# Patient Record
Sex: Male | Born: 1985 | Race: White | Hispanic: No | Marital: Single | Smoking: Never smoker
Health system: Southern US, Community
[De-identification: ages and names within clinical notes are randomized; demographics above are authoritative.]

## PROBLEM LIST (undated history)

## (undated) DIAGNOSIS — I10 Essential (primary) hypertension: Secondary | ICD-10-CM

## (undated) DIAGNOSIS — F32A Depression, unspecified: Secondary | ICD-10-CM

## (undated) DIAGNOSIS — F419 Anxiety disorder, unspecified: Secondary | ICD-10-CM

## (undated) DIAGNOSIS — F4310 Post-traumatic stress disorder, unspecified: Secondary | ICD-10-CM

## (undated) DIAGNOSIS — K219 Gastro-esophageal reflux disease without esophagitis: Secondary | ICD-10-CM

## (undated) HISTORY — DX: Gastro-esophageal reflux disease without esophagitis: K21.9

## (undated) HISTORY — PX: HERNIA REPAIR: SHX51

---

## 2003-04-21 ENCOUNTER — Encounter: Payer: Self-pay | Admitting: Emergency Medicine

## 2003-04-21 ENCOUNTER — Emergency Department (HOSPITAL_COMMUNITY): Admission: EM | Admit: 2003-04-21 | Discharge: 2003-04-21 | Payer: Self-pay | Admitting: Emergency Medicine

## 2003-05-01 ENCOUNTER — Emergency Department (HOSPITAL_COMMUNITY): Admission: EM | Admit: 2003-05-01 | Discharge: 2003-05-01 | Payer: Self-pay | Admitting: Emergency Medicine

## 2019-02-12 ENCOUNTER — Emergency Department: Payer: Self-pay

## 2019-02-12 ENCOUNTER — Other Ambulatory Visit: Payer: Self-pay

## 2019-02-12 ENCOUNTER — Emergency Department
Admission: EM | Admit: 2019-02-12 | Discharge: 2019-02-12 | Disposition: A | Discharge status: Home / Self Care | Payer: Self-pay | Attending: Emergency Medicine | Admitting: Emergency Medicine

## 2019-02-12 ENCOUNTER — Encounter: Payer: Self-pay | Admitting: Emergency Medicine

## 2019-02-12 DIAGNOSIS — R1011 Right upper quadrant pain: Secondary | ICD-10-CM

## 2019-02-12 DIAGNOSIS — K8050 Calculus of bile duct without cholangitis or cholecystitis without obstruction: Secondary | ICD-10-CM | POA: Insufficient documentation

## 2019-02-12 LAB — CBC WITH DIFFERENTIAL/PLATELET
Abs Immature Granulocytes: 0.04 10*3/uL (ref 0.00–0.07)
Basophils Absolute: 0.1 10*3/uL (ref 0.0–0.1)
Basophils Relative: 1 %
Eosinophils Absolute: 0.9 10*3/uL — ABNORMAL HIGH (ref 0.0–0.5)
Eosinophils Relative: 8 %
HCT: 46.8 % (ref 39.0–52.0)
Hemoglobin: 15.6 g/dL (ref 13.0–17.0)
Immature Granulocytes: 0 %
Lymphocytes Relative: 31 %
Lymphs Abs: 3.3 10*3/uL (ref 0.7–4.0)
MCH: 29.2 pg (ref 26.0–34.0)
MCHC: 33.3 g/dL (ref 30.0–36.0)
MCV: 87.5 fL (ref 80.0–100.0)
Monocytes Absolute: 1.1 10*3/uL — ABNORMAL HIGH (ref 0.1–1.0)
Monocytes Relative: 10 %
Neutro Abs: 5.4 10*3/uL (ref 1.7–7.7)
Neutrophils Relative %: 50 %
Platelets: 329 10*3/uL (ref 150–400)
RBC: 5.35 MIL/uL (ref 4.22–5.81)
RDW: 12.7 % (ref 11.5–15.5)
WBC: 10.9 10*3/uL — ABNORMAL HIGH (ref 4.0–10.5)
nRBC: 0 % (ref 0.0–0.2)

## 2019-02-12 LAB — BASIC METABOLIC PANEL
Anion gap: 10 (ref 5–15)
BUN: 12 mg/dL (ref 6–20)
CO2: 25 mmol/L (ref 22–32)
Calcium: 8.9 mg/dL (ref 8.9–10.3)
Chloride: 103 mmol/L (ref 98–111)
Creatinine, Ser: 0.96 mg/dL (ref 0.61–1.24)
GFR calc Af Amer: >60 mL/min (ref >60)
GFR calc non Af Amer: >60 mL/min (ref >60)
Glucose, Bld: 136 mg/dL — ABNORMAL HIGH (ref 70–99)
Potassium: 3.3 mmol/L — ABNORMAL LOW (ref 3.5–5.1)
Sodium: 138 mmol/L (ref 135–145)

## 2019-02-12 LAB — URINALYSIS, COMPLETE (UACMP) WITH MICROSCOPIC
Bacteria, UA: NONE SEEN
Bilirubin Urine: NEGATIVE
Glucose, UA: NEGATIVE mg/dL
Hgb urine dipstick: NEGATIVE
Ketones, ur: NEGATIVE mg/dL
Leukocytes,Ua: NEGATIVE
Nitrite: NEGATIVE
Protein, ur: NEGATIVE mg/dL
Specific Gravity, Urine: 1.026 (ref 1.005–1.030)
pH: 6 (ref 5.0–8.0)

## 2019-02-12 LAB — HEPATIC FUNCTION PANEL
ALT: 24 U/L (ref 0–44)
AST: 26 U/L (ref 15–41)
Albumin: 4.6 g/dL (ref 3.5–5.0)
Alkaline Phosphatase: 60 U/L (ref 38–126)
Bilirubin, Direct: <0.1 mg/dL (ref 0.0–0.2)
Total Bilirubin: 0.4 mg/dL (ref 0.3–1.2)
Total Protein: 7.4 g/dL (ref 6.5–8.1)

## 2019-02-12 LAB — LIPASE, BLOOD: Lipase: 36 U/L (ref 11–51)

## 2019-02-12 MED ORDER — IOHEXOL 300 MG/ML  SOLN
100.0000 mL | Freq: Once | INTRAMUSCULAR | Status: AC | PRN
  Administered 2019-02-12: 100 mL via INTRAVENOUS

## 2019-02-12 MED ORDER — ONDANSETRON HCL 4 MG/2ML IJ SOLN
4.0000 mg | Freq: Once | INTRAMUSCULAR | Status: AC
  Administered 2019-02-12: 20:00:00 4 mg via INTRAVENOUS
  Filled 2019-02-12: qty 2

## 2019-02-12 MED ORDER — SODIUM CHLORIDE 0.9 % IV BOLUS
1000.0000 mL | Freq: Once | INTRAVENOUS | Status: AC
  Administered 2019-02-12: 21:00:00 1000 mL via INTRAVENOUS

## 2019-02-12 MED ORDER — HYDROMORPHONE HCL 1 MG/ML IJ SOLN
1.0000 mg | Freq: Once | INTRAMUSCULAR | Status: AC
  Administered 2019-02-12: 22:00:00 1 mg via INTRAVENOUS
  Filled 2019-02-12: qty 1

## 2019-02-12 MED ORDER — OXYCODONE-ACETAMINOPHEN 5-325 MG PO TABS: 1.0000 | ORAL_TABLET | Freq: Four times a day (QID) | ORAL | 0 refills | Status: AC | PRN

## 2019-02-12 MED ORDER — HYDROMORPHONE HCL 1 MG/ML IJ SOLN
1.0000 mg | Freq: Once | INTRAMUSCULAR | Status: AC
  Administered 2019-02-12: 1 mg via INTRAVENOUS
  Filled 2019-02-12: qty 1

## 2019-02-12 NOTE — ED Provider Notes (Signed)
[facility] Emergency Department Provider Note ____________________________________________   First MD Initiated Contact with Patient 02/12/19 1955     (approximate)  I have reviewed the triage vital signs and the nursing notes.   HISTORY  Chief Complaint Abdominal Pain    HPI Brett Murray is a 33 y.o. male with no significant PMH who presents with right upper quadrant abdominal pain, acute onset around 2 hours ago, associated with nausea but no vomiting.  He states he had an episode of pain like this a few months ago which lasted for a few hours and then resolved on its own, but this is more severe.  He denies any change in his bowel movements.  He has no urinary symptoms.  He states he had an ultrasound in the past that showed some gallstones.  History reviewed. No pertinent past medical history.  There are no active problems to display for this patient.   History reviewed. No pertinent surgical history.  Prior to Admission medications   Medication Sig Start Date End Date Taking? Authorizing Provider  oxyCODONE-acetaminophen (PERCOCET) 5-325 MG tablet Take 1-2 tablets by mouth every 6 (six) hours as needed for up to 5 days for severe pain. 02/12/19 02/17/19  Dionne Bucy, MD    Allergies Patient has no known allergies.  No family history on file.  Social History Social History   Tobacco Use   Smoking status: Never Smoker   Smokeless tobacco: Never Used  Substance Use Topics   Alcohol use: Yes    Comment: "rarely"   Drug use: Not on file    Review of Systems  Constitutional: No fever. Eyes: No visual changes. ENT: No sore throat. Cardiovascular: Denies chest pain. Respiratory: Denies shortness of breath or cough. Gastrointestinal: Positive for nausea. Genitourinary: Negative for flank pain.  Musculoskeletal: Negative for back pain. Skin: Negative for rash. Neurological: Negative for headaches, focal weakness or  numbness.   ____________________________________________   PHYSICAL EXAM:  VITAL SIGNS: ED Triage Vitals  Enc Vitals Group     BP 02/12/19 1944 (!) 152/88     Pulse Rate 02/12/19 1944 80     Resp 02/12/19 1944 12     Temp 02/12/19 1944 98.8 F (37.1 C)     Temp Source 02/12/19 1944 Oral     SpO2 02/12/19 1944 100 %     Weight 02/12/19 1936 200 lb (90.7 kg)     Height 02/12/19 1936  (1.702 m)     Head Circumference --      Peak Flow --      Pain Score 02/12/19 1936 9     Pain Loc --      Pain Edu? --      Excl. in GC? --     Constitutional: Alert and oriented.  Uncomfortable appearing but in no acute distress. Eyes: Conjunctivae are normal.  No scleral icterus. Head: Atraumatic. Nose: No congestion/rhinnorhea. Mouth/Throat: Mucous membranes are moist.   Neck: Normal range of motion.  Cardiovascular: Good peripheral circulation. Respiratory: Normal respiratory effort.  No retractions. Gastrointestinal: Soft with moderate right upper quadrant tenderness.  No distention.  Genitourinary: No flank tenderness. Musculoskeletal: Extremities warm and well perfused.  Neurologic:  Normal speech and language. No gross focal neurologic deficits are appreciated.  Skin:  Skin is warm and dry. No rash noted. Psychiatric: Mood and affect are normal. Speech and behavior are normal.  ____________________________________________   LABS (all labs ordered are listed, but only abnormal results are displayed)  Labs Reviewed  BASIC METABOLIC PANEL - Abnormal; Notable for the following components:      Result Value   Potassium 3.3 (*)    Glucose, Bld 136 (*)    All other components within normal limits  CBC WITH DIFFERENTIAL/PLATELET - Abnormal; Notable for the following components:   WBC 10.9 (*)    Monocytes Absolute 1.1 (*)    Eosinophils Absolute 0.9 (*)    All other components within normal limits  URINALYSIS, COMPLETE (UACMP) WITH MICROSCOPIC - Abnormal; Notable for the  following components:   Color, Urine STRAW (*)    APPearance CLEAR (*)    All other components within normal limits  HEPATIC FUNCTION PANEL  LIPASE, BLOOD   ____________________________________________  EKG   ____________________________________________  RADIOLOGY  [place] RUQ: No acute abnormality CT abdomen: Gallstone with no other acute abnormality  ____________________________________________   PROCEDURES  Procedure(s) performed: No  Procedures  Critical Care performed: No ____________________________________________   INITIAL IMPRESSION / ASSESSMENT AND PLAN / ED COURSE  Pertinent labs & imaging results that were available during my care of the patient were reviewed by me and considered in my medical decision making (see chart for details).  33 year old male with no significant PMH (although the patient does report he had an ultrasound which showed gallstones in the past) presents with acute onset of right upper quadrant pain over the last few hours associated with nausea.  He states he had a similar episode a few months ago that resolved on its own, and he never sought care at that time.  On exam he is uncomfortable and in acute pain.  He is hypertensive but his other vital signs are normal.  He is tender in the right upper quadrant.  Overall presentation is most consistent with biliary colic versus acute cholecystitis.  Differential also includes pancreatitis, gastritis, or less likely colitis.  He has no tenderness in the right lower quadrant.  We will obtain lab work-up, right upper quadrant ultrasound, and give analgesia and fluids.  ----------------------------------------- 11:13 PM on 02/12/2019 -----------------------------------------  Ultrasound showed no acute abnormalities, however given the patient's severe pain I was still concerned for possible hepatobiliary etiology.  CT was obtained which showed a gallstone.  Neither imaging test demonstrates wall  thickening or pericholecystic fluid.  The lab work-up is unremarkable.  The patient's pain is now completely resolved.  Overall presentation is consistent with biliary colic.  At this time, the patient is stable for discharge home.  I counseled him on the results of the work-up.  He will need to follow-up with a general surgeon although he likely will not be able to have an elective cholecystectomy anytime soon given the current COVID-19 pandemic.  I gave him thorough return precautions and a referral to general surgery.  He expressed understanding, and agreed with the plan.  ______________________  Junious Silk was evaluated in Emergency Department on 02/12/2019 for the symptoms described in the history of present illness. He was evaluated in the context of the global COVID-19 pandemic, which necessitated consideration that the patient might be at risk for infection with the SARS-CoV-2 virus that causes COVID-19. Institutional protocols and algorithms that pertain to the evaluation of patients at risk for COVID-19 are in a state of rapid change based on information released by regulatory bodies including the CDC and federal and state organizations. These policies and algorithms were followed during the patient's care in the ED. ____________________________________________   FINAL CLINICAL IMPRESSION(S) / ED DIAGNOSES  Final diagnoses:  Right upper quadrant abdominal pain  Biliary colic      NEW MEDICATIONS STARTED DURING THIS VISIT:  New Prescriptions   OXYCODONE-ACETAMINOPHEN (PERCOCET) 5-325 MG TABLET    Take 1-2 tablets by mouth every 6 (six) hours as needed for up to 5 days for severe pain.     Note:  This document was prepared using Dragon voice recognition software and may include unintentional dictation errors.    Dionne BucySiadecki, Daemian Gahm, MD 02/12/19 2315

## 2019-02-12 NOTE — ED Triage Notes (Signed)
Patient ambulatory to triage with steady gait, without difficulty, appears uncomfortable, grimacing; c/o sudden onset right upper abd pain radiating into back upon awakening at 630pm (works 3rd shift); denies any accomp symptoms, denies hx of same

## 2019-02-12 NOTE — Discharge Instructions (Addendum)
Make an appointment to follow-up with a general surgeon within the next few weeks.  Return to the ER for new, worsening, or persistent severe abdominal pain, vomiting, fever, weakness, or any other new or worsening symptoms that concern you.

## 2019-06-10 ENCOUNTER — Other Ambulatory Visit: Payer: Self-pay

## 2019-06-10 DIAGNOSIS — Z20822 Contact with and (suspected) exposure to covid-19: Secondary | ICD-10-CM

## 2019-06-11 LAB — NOVEL CORONAVIRUS, NAA: SARS-CoV-2, NAA: NOT DETECTED

## 2019-08-08 ENCOUNTER — Other Ambulatory Visit: Payer: Self-pay

## 2019-08-08 DIAGNOSIS — Z20822 Contact with and (suspected) exposure to covid-19: Secondary | ICD-10-CM

## 2019-08-10 LAB — NOVEL CORONAVIRUS, NAA: SARS-CoV-2, NAA: NOT DETECTED

## 2020-05-17 ENCOUNTER — Other Ambulatory Visit: Payer: Self-pay

## 2020-05-17 ENCOUNTER — Emergency Department (HOSPITAL_COMMUNITY)
Admission: EM | Admit: 2020-05-17 | Discharge: 2020-05-18 | Disposition: A | Discharge status: Home / Self Care | Payer: No Typology Code available for payment source | Attending: Emergency Medicine | Admitting: Emergency Medicine

## 2020-05-17 ENCOUNTER — Encounter (HOSPITAL_COMMUNITY): Payer: Self-pay | Admitting: Emergency Medicine

## 2020-05-17 DIAGNOSIS — F10929 Alcohol use, unspecified with intoxication, unspecified: Secondary | ICD-10-CM | POA: Diagnosis not present

## 2020-05-17 DIAGNOSIS — R45851 Suicidal ideations: Secondary | ICD-10-CM

## 2020-05-17 DIAGNOSIS — Z79899 Other long term (current) drug therapy: Secondary | ICD-10-CM | POA: Diagnosis not present

## 2020-05-17 DIAGNOSIS — F332 Major depressive disorder, recurrent severe without psychotic features: Secondary | ICD-10-CM | POA: Insufficient documentation

## 2020-05-17 DIAGNOSIS — F4310 Post-traumatic stress disorder, unspecified: Secondary | ICD-10-CM | POA: Diagnosis not present

## 2020-05-17 DIAGNOSIS — F329 Major depressive disorder, single episode, unspecified: Secondary | ICD-10-CM | POA: Diagnosis present

## 2020-05-17 DIAGNOSIS — Y908 Blood alcohol level of 240 mg/100 ml or more: Secondary | ICD-10-CM | POA: Diagnosis not present

## 2020-05-17 DIAGNOSIS — F10920 Alcohol use, unspecified with intoxication, uncomplicated: Secondary | ICD-10-CM

## 2020-05-17 HISTORY — DX: Anxiety disorder, unspecified: F41.9

## 2020-05-17 HISTORY — DX: Post-traumatic stress disorder, unspecified: F43.10

## 2020-05-17 HISTORY — DX: Depression, unspecified: F32.A

## 2020-05-17 HISTORY — DX: Essential (primary) hypertension: I10

## 2020-05-17 MED ORDER — ACETAMINOPHEN 325 MG PO TABS: 650.0000 mg | ORAL_TABLET | ORAL | Status: DC | PRN

## 2020-05-17 MED ORDER — ONDANSETRON HCL 4 MG PO TABS: 4.0000 mg | ORAL_TABLET | Freq: Three times a day (TID) | ORAL | Status: DC | PRN

## 2020-05-17 MED ORDER — TRAZODONE HCL 50 MG PO TABS
50.0000 mg | ORAL_TABLET | Freq: Every day | ORAL | Status: DC
  Administered 2020-05-18: 50 mg via ORAL
  Filled 2020-05-17: qty 1

## 2020-05-17 MED ORDER — ALUM & MAG HYDROXIDE-SIMETH 200-200-20 MG/5ML PO SUSP: 30.0000 mL | Freq: Four times a day (QID) | ORAL | Status: DC | PRN

## 2020-05-17 NOTE — BH Assessment (Addendum)
Comprehensive Clinical Assessment (CCA) Note  05/18/2020 DEVAUNTE GASPARINI [id number]  RISHAAN GUNNER is a 34 year old male who presents voluntary and accompanied to [facility]. Clinician asked the pt, "what brought you to the hospital?" Pt reported, he has a drug problem. Pt reported, he drinks a case of beer, daily. Pt reported, using a gram of cocaine two or three days ago. Pt reported, he has suicidal thoughts all day, everyday with no plan but has access to guns, knives. Pt was in the [employer] and had several tours in [place]. Pt reported, when coming down off alcohol he'll see something out of the corner of his eye. Pt denies, HI, self-injurious behaviors.   Pt reported, previous inpatient admissions. Pt's BAL and UDS are pending. Pt is linked to the [facility] in [place], [place] for medication management and counseling. Pt reported, he is prescribed, Trazodone, Wellbutrin and couple others the he can not remember. Pt reported, when drinking alcohol he does not take his medications. Pt reported, previous inpatient admissions, pt's most recent hospitalization was a year to a year and a half ago at the [facility] in [place], [place].   Pt presents tearful at times, alert in scrubs with clear and coherent speech. Pt's eye contact was normal. Pt's mood, affect was depressed. Pt's thought content appropriate to mood and circumstances. Pt's insight was fair. Pt's judgement was poor. Pt reported, if inpatient treatment is recommended he will sign-in voluntarily. Pt reported, if discharged from [facility] he could not contract for safety.   Diagnosis: Major Depressive Disorder, recurrent, severe.                    PTSD.                   Alcohol use Disorder severe.   Disposition: Nira Conn, NP recommends inpatient treatment. [facility], if no beds, TTS to seek placement. Discussed with Casimiro Needle, RN, Katha Cabal, RN. Casimiro Needle, RN to discuss disposition with EDP.     ED from 05/17/2020 in [facility]  EMERGENCY DEPARTMENT  C-SSRS RISK CATEGORY High Risk      Visit Diagnosis:   No diagnosis found.    CCA Screening, Triage and Referral (STR)  Patient Reported Information How did you hear about [place]? No data recorded Referral name: No data recorded Referral phone number: No data recorded  Whom do you see for routine medical problems? No data recorded Practice/Facility Name: No data recorded Practice/Facility Phone Number: No data recorded Name of Contact: No data recorded Contact Number: No data recorded Contact Fax Number: No data recorded Prescriber Name: No data recorded Prescriber Address (if known): No data recorded  What Is the Reason for Your Visit/Call Today? No data recorded How Long Has This Been Causing You Problems? No data recorded What Do You Feel Would Help You the Most Today? No data recorded  Have You Recently Been in Any Inpatient Treatment (Hospital/Detox/Crisis Center/28-Day Program)? No data recorded Name/Location of Program/Hospital:No data recorded How Long Were You There? No data recorded When Were You Discharged? No data recorded  Have You Ever Received Services From [facility] Before? No data recorded Who Do You See at  [facility]? No data recorded  Have You Recently Had Any Thoughts About Hurting Yourself? No data recorded Are You Planning to Commit Suicide/Harm Yourself At This time? No data recorded  Have you Recently Had Thoughts About Hurting Someone Karolee Ohs? No data recorded Explanation:  No data recorded  Have You Used Any Alcohol or Drugs in the Past 24 Hours? No data recorded How Long Ago Did You Use Drugs or Alcohol? No data recorded What Did You Use and How Much? No data recorded  Do You Currently Have a Therapist/Psychiatrist? No data recorded Name of Therapist/Psychiatrist: No data recorded  Have You Been Recently Discharged From Any Office Practice or Programs? No data recorded Explanation of Discharge From Practice/Program: No data  recorded    CCA Screening Triage Referral Assessment Type of Contact: No data recorded Is this Initial or Reassessment? No data recorded Date Telepsych consult ordered in [facility]:  No data recorded Time Telepsych consult ordered in [facility]:  No data recorded  Patient Reported Information Reviewed? No data recorded Patient Left Without Being Seen? No data recorded Reason for Not Completing Assessment: No data recorded  Collateral Involvement: No data recorded  Does Patient Have a Court Appointed Legal Guardian? No data recorded Name and Contact of Legal Guardian: No data recorded If Minor and Not Living with Parent(s), Who has Custody? No data recorded Is CPS involved or ever been involved? No data recorded Is APS involved or ever been involved? No data recorded  Patient Determined To Be At Risk for Harm To Self or Others Based on Review of Patient Reported Information or Presenting Complaint? No data recorded Method: No data recorded Availability of Means: No data recorded Intent: No data recorded Notification Required: No data recorded Additional Information for Danger to Others Potential: No data recorded Additional Comments for Danger to Others Potential: No data recorded Are There Guns or Other Weapons in Your Home? No data recorded Types of Guns/Weapons: No data recorded Are These Weapons Safely Secured?                            No data recorded Who Could Verify You Are Able To Have These Secured: No data recorded Do You Have any Outstanding Charges, Pending Court Dates, Parole/Probation? No data recorded Contacted To Inform of Risk of Harm To Self or Others: No data recorded  Location of Assessment: No data recorded  Does Patient Present under Involuntary Commitment? No data recorded IVC Papers Initial File Date: No data recorded  [place] of Residence: No data recorded  Patient Currently Receiving the Following Services: No data recorded  Determination of Need: No data  recorded  Options For Referral: No data recorded    CCA Biopsychosocial  Intake/Chief Complaint:  CCA Intake With Chief Complaint CCA Part Two Date: 05/17/20 CCA Part Two Time: 0020 Chief Complaint/Presenting Problem: Alcohol and coacine use, depression and suicidal ideations with access to weapons. Patient's Currently Reported Symptoms/Problems: Alcohol and coacine use, depression and suicidal ideations with access to weapons. Individual's Strengths: Pt reported, "I don't know, nothing right now, so many problems. Type of Services Patient Feels Are Needed: Pt reported, needing help before he hurts himself. Pt reported, this has been going on for a long time.  Mental Health Symptoms Depression:  Depression: Difficulty Concentrating, Fatigue, Hopelessness, Worthlessness, Irritability, Sleep (too much or little)  Mania:  Mania: None  Anxiety:   Anxiety: Irritability, Tension, Worrying  Psychosis:  Psychosis: None  Trauma:  Trauma:  (Pt has PTSD.)  Obsessions:  Obsessions: None  Compulsions:  Compulsions: None  Inattention:  Inattention: None  Hyperactivity/Impulsivity:  Hyperactivity/Impulsivity: N/A  Oppositional/Defiant Behaviors:  Oppositional/Defiant Behaviors: None  Emotional Irregularity:     Other Mood/Personality Symptoms:  Mental Status Exam Appearance and self-care  Stature:     Weight:     Clothing:  Clothing:  (Scrubs.)  Grooming:  Grooming:  (Scrubs.)  Cosmetic use:  Cosmetic Use: None  Posture/gait:  Posture/Gait: Normal  Motor activity:  Motor Activity: Not Remarkable  Sensorium  Attention:  Attention: Normal  Concentration:  Concentration: Normal  Orientation:  Orientation: X5  Recall/memory:  Recall/Memory: Normal  Affect and Mood  Affect:     Mood:  Mood: Depressed  Relating  Eye contact:  Eye Contact: Normal  Facial expression:  Facial Expression: Depressed  Attitude toward examiner:  Attitude Toward Examiner: Cooperative  Thought and Language   Speech flow: Speech Flow: Clear and Coherent  Thought content:  Thought Content: Appropriate to Mood and Circumstances  Preoccupation:  Preoccupations: Other (Comment) (Depression and substance use.)  Hallucinations:  Hallucinations: Visual (Only when coming down from alcohol.)  Organization:     Company secretary of Knowledge:  Fund of Knowledge: Good  Intelligence:     Abstraction:  Abstraction:  (UTA)  Judgement:  Judgement: Poor  Reality Testing:     Insight:  Insight: Fair  Decision Making:  Decision Making:  Industrial/product designer)  Social Functioning  Social Maturity:  Social Maturity:  Industrial/product designer)  Social Judgement:  Social Judgement:  (UTA)  Stress  Stressors:  Stressors: Other (Comment) (Substance use and other problems.)  Coping Ability:  Coping Ability:  (UTA)  Skill Deficits:  Skill Deficits:  (UTA)  Supports:  Supports: Family     Religion: Religion/Spirituality Are You A Religious Person?: Yes What is Your Religious Affiliation?: Christian  Leisure/Recreation: Leisure / Recreation Do You Have Hobbies?: Yes Leisure and Hobbies: Golf.  Exercise/Diet: Exercise/Diet Do You Exercise?: Yes (When sober.) What Type of Exercise Do You Do?:  (Pt reported, when sober but not in a while.) Do You Have Any Trouble Sleeping?: Yes Explanation of Sleeping Difficulties: Pt reported, needing Trazodone to help with sleep and he still wakes up in the middle of the night.   CCA Employment/Education  Employment/Work Situation: Employment / Work Situation Employment situation: Employed Has patient ever been in the [employer]?: Yes (Describe in comment) (Pt was in the [employer]	[place] and had several tours in [place]. Pt reported, he was in [place] for 14 months.)  Education: Education Is Patient Currently Attending School?: No Did Garment/textile technologist From [employer]?: Yes Did You Attend College?: Yes What Type of College Degree Do you Have?: Pt has a B.S. in Air cabin crew. Did You Attend Graduate  School?: No What Was Your Major?: NA Did You Have Any Special Interests In School?: NA Did You Have An Individualized Education Program (IIEP):  (NA) Did You Have Any Difficulty At School?:  (NA) Patient's Education Has Been Impacted by Current Illness:  (NA)   CCA Family/Childhood History  Family and Relationship History: Family history Marital status: Single Does patient have children?: No  Childhood History:  Childhood History Additional childhood history information: NA Description of patient's relationship with caregiver when they were a child: NA Patient's description of current relationship with people who raised him/her: NA How were you disciplined when you got in trouble as a child/adolescent?: NA Does patient have siblings?:  (NA) Did patient suffer any verbal/emotional/physical/sexual abuse as a child?: No Did patient suffer from severe childhood neglect?: No Has patient ever been sexually abused/assaulted/raped as an adolescent or adult?: No Was the patient ever a victim of a crime or a disaster?: No Witnessed domestic violence?: No Has  patient been affected by domestic violence as an adult?: No  Child/Adolescent Assessment:     CCA Substance Use  Alcohol/Drug Use: Alcohol / Drug Use Pain Medications: See MAR Prescriptions: See MAR Over the Counter: See MAR History of alcohol / drug use?: Yes Substance #1 Name of Substance 1: Alcohol. 1 - Age of First Use: Years. 1 - Amount (size/oz): Pt reported, drinking a case of beer, daily. 1 - Frequency: Daily. 1 - Duration: Ongoing. 1 - Last Use / Amount: A couple hours ago. Substance #2 Name of Substance 2: Cocaine. 2 - Age of First Use: UTA 2 - Amount (size/oz): Pt reported, using a gram of cocaine, 2 or 3 days ago 2 - Frequency: For years but not regular. 2 - Duration: Ongoing. 2 - Last Use / Amount: 2 or 3 days ago.     ASAM's:  Six Dimensions of Multidimensional Assessment  Dimension 1:  Acute  Intoxication and/or Withdrawal Potential:      Dimension 2:  Biomedical Conditions and Complications:      Dimension 3:  Emotional, Behavioral, or Cognitive Conditions and Complications:     Dimension 4:  Readiness to Change:     Dimension 5:  Relapse, Continued use, or Continued Problem Potential:     Dimension 6:  Recovery/Living Environment:     ASAM Severity Score:    ASAM Recommended Level of Treatment:     Substance use Disorder (SUD)    Recommendations for Services/Supports/Treatments:    DSM5 Diagnoses: There are no problems to display for this patient.    Referrals to Alternative Service(s): Referred to Alternative Service(s):   Place:   Date:   Time:    Referred to Alternative Service(s):   Place:   Date:   Time:    Referred to Alternative Service(s):   Place:   Date:   Time:    Referred to Alternative Service(s):   Place:   Date:   Time:     Redmond Pullingreylese D Hazim Treadway  Comprehensive Clinical Assessment (CCA) Screening, Triage and Referral Note  05/18/2020 Junious SilkMatthew J Swinger [id number]  Visit Diagnosis: No diagnosis found.  Patient Reported Information How did you hear about us? No data recorded  Referral name: No data recorded  Referral phone number: No data recorded Whom do you see for routine medical problems? No data recorded  Practice/Facility Name: No data recorded  Practice/Facility Phone Number: No data recorded  Name of Contact: No data recorded  Contact Number: No data recorded  Contact Fax Number: No data recorded  Prescriber Name: No data recorded  Prescriber Address (if known): No data recorded What Is the Reason for Your Visit/Call Today? No data recorded How Long Has This Been Causing You Problems? No data recorded Have You Recently Been in Any Inpatient Treatment (Hospital/Detox/Crisis Center/28-Day Program)? No data recorded  Name/Location of Program/Hospital:No data recorded  How Long Were You There? No data recorded  When Were You Discharged? No  data recorded [facility]? No data recorded  [facility]? No data recorded Have You Recently Had Any Thoughts About Hurting Yourself? No data recorded  Are You Planning to Commit Suicide/Harm Yourself At This time?  No data recorded Have you Recently Had Thoughts About Hurting Someone Karolee Ohslse? No data recorded  Explanation: No data recorded Have You Used Any Alcohol or Drugs in the Past 24 Hours? No data recorded  How Long Ago Did You Use Drugs or Alcohol?  No data recorded  What Did You Use and How Much? No data recorded What Do You Feel Would Help You the Most Today? No data recorded Do You Currently Have a Therapist/Psychiatrist? No data recorded  Name of Therapist/Psychiatrist: No data recorded  Have You Been Recently Discharged From Any Office Practice or Programs? No data recorded  Explanation of Discharge From Practice/Program:  No data recorded    CCA Screening Triage Referral Assessment Type of Contact: No data recorded  Is this Initial or Reassessment? No data recorded  Date Telepsych consult ordered in [facility]:  No data recorded  Time Telepsych consult ordered in [facility]:  No data recorded Patient Reported Information Reviewed? No data recorded  Patient Left Without Being Seen? No data recorded  Reason for Not Completing Assessment: No data recorded Collateral Involvement: No data recorded Does Patient Have a Court Appointed Legal Guardian? No data recorded  Name and Contact of Legal Guardian:  No data recorded If Minor and Not Living with Parent(s), Who has Custody? No data recorded Is CPS involved or ever been involved? No data recorded Is APS involved or ever been involved? No data recorded Patient Determined To Be At Risk for Harm To Self or Others Based on Review of Patient Reported Information or Presenting Complaint? No data recorded  Method: No data recorded  Availability of Means: No data recorded  Intent: No  data recorded  Notification Required: No data recorded  Additional Information for Danger to Others Potential:  No data recorded  Additional Comments for Danger to Others Potential:  No data recorded  Are There Guns or Other Weapons in Your Home?  No data recorded   Types of Guns/Weapons: No data recorded   Are These Weapons Safely Secured?                              No data recorded   Who Could Verify You Are Able To Have These Secured:    No data recorded Do You Have any Outstanding Charges, Pending Court Dates, Parole/Probation? No data recorded Contacted To Inform of Risk of Harm To Self or Others: No data recorded Location of Assessment: No data recorded Does Patient Present under Involuntary Commitment? No data recorded  IVC Papers Initial File Date: No data recorded  [place] of Residence: No data recorded Patient Currently Receiving the Following Services: No data recorded  Determination of Need: No data recorded  Options For Referral: No data recorded  Redmond Pulling, [facility]   Redmond Pulling, MS, [facility], [facility] Triage Specialist [phone]

## 2020-05-17 NOTE — ED Triage Notes (Signed)
Pt c/o having suicidal thoughts and substance abuse problems. Pt states he drinks 24 beers a day and last used cocaine x 3 days ago.

## 2020-05-17 NOTE — ED Notes (Signed)
Pt is a veteran of [place] x several tours- [employer]	[place] Reports in and out of hospitals since he got out of [employer]   Reports his shame and sorrow that he is putting his family through so much   He reports drinking excessively   Is tearful throughout with multiple apologies for making this night worse for staff  He reports he cannot "see a way forward" through tears   His mother is retired from the system   He is sitting cross legged on the stretcher  Rocking back and forth   Active listening, judicious touch  Veteran to veteran support

## 2020-05-17 NOTE — ED Notes (Addendum)
Pt contacts:  Benson Setting (uncle): [phone] Theotis Burrow (aunt): [phone]  Pt stated it was okay to update parents on placement.

## 2020-05-17 NOTE — ED Provider Notes (Signed)
[facility] EMERGENCY DEPARTMENT Provider Note   CSN: [id number] Arrival date & time: 05/17/20  2256   History Chief Complaint  Patient presents with  . Suicidal thoughts    Brett Murray is a 34 y.o. male.  The history is provided by the patient.  He has history of hypertension, depression, posttraumatic stress disorder and comes in with worsening depression symptoms and suicidal ideation.  He states he does not have an actual plan.  He also has been abusing drugs, mostly alcohol.  He states that he drinks a case of beer a day.  He does not do much during the day except for drink beer.  He has used cocaine, but not for the last several days.  He does admit to crying spells but denies early morning wakening.  He has had anhedonia.  He denies hallucinations.  He is under a therapist care through the [employer].  He came in tonight because he felt that he just had no other options.  He has been vaccinated against COVID-19.  Past Medical History:  Diagnosis Date  . Anxiety   . Depression   . Hypertension   . PTSD (post-traumatic stress disorder)     There are no problems to display for this patient.   Past Surgical History:  Procedure Laterality Date  . HERNIA REPAIR         No family history on file.  Social History   Tobacco Use  . Smoking status: Never Smoker  . Smokeless tobacco: Never Used  Substance Use Topics  . Alcohol use: Yes    Alcohol/week: 24.0 standard drinks    Types: 24 Cans of beer per week    Comment: 24 beers a day  . Drug use: Yes    Types: Cocaine    Comment: last use was 05/14/20    Home Medications Prior to Admission medications   Not on File    Allergies    Patient has no known allergies.  Review of Systems   Review of Systems  All other systems reviewed and are negative.   Physical Exam Updated Vital Signs BP (!) 164/99   Pulse (!) 110   Temp 98.1 F (36.7 C)   Resp 18   Ht 5\' 7"  (1.702 m)   Wt 90.7 kg    SpO2 99%   BMI 31.32 kg/m   Physical Exam Vitals and nursing note reviewed.   34 year old male, resting comfortably and in no acute distress. Vital signs are significant for elevated heart rate and blood pressure. Oxygen saturation is 99%, which is normal. Head is normocephalic and atraumatic. PERRLA, EOMI. Oropharynx is clear. Neck is nontender and supple without adenopathy or JVD. Back is nontender and there is no CVA tenderness. Lungs are clear without rales, wheezes, or rhonchi. Chest is nontender. Heart has regular rate and rhythm without murmur. Abdomen is soft, flat, nontender without masses or hepatosplenomegaly and peristalsis is normoactive. Extremities have no cyanosis or edema, full range of motion is present. Skin is warm and dry without rash. Neurologic: Mental status is normal, cranial nerves are intact, there are no motor or sensory deficits. Psychiatric: Flat affect but makes good eye contact and speaks with normal inflections.  ED Results / Procedures / Treatments   Labs (all labs ordered are listed, but only abnormal results are displayed) Labs Reviewed  COMPREHENSIVE METABOLIC PANEL - Abnormal; Notable for the following components:      Result Value   CO2 21 (*)  Glucose, Bld 113 (*)    Calcium 8.7 (*)    All other components within normal limits  ETHANOL - Abnormal; Notable for the following components:   Alcohol, Ethyl (B) 266 (*)    All other components within normal limits  SALICYLATE LEVEL - Abnormal; Notable for the following components:   Salicylate Lvl <7.0 (*)    All other components within normal limits  ACETAMINOPHEN LEVEL - Abnormal; Notable for the following components:   Acetaminophen (Tylenol), Serum <10 (*)    All other components within normal limits  RAPID URINE DRUG SCREEN, HOSP PERFORMED - Abnormal; Notable for the following components:   Cocaine POSITIVE (*)    All other components within normal limits  SARS CORONAVIRUS 2 BY RT PCR  (HOSPITAL ORDER, PERFORMED IN [facility])  CBC    Procedures Procedures   Medications Ordered in ED Medications  acetaminophen (TYLENOL) tablet 650 mg (has no administration in time range)  ondansetron (ZOFRAN) tablet 4 mg (has no administration in time range)  alum & mag hydroxide-simeth (MAALOX/MYLANTA) 200-200-20 MG/5ML suspension 30 mL (has no administration in time range)  traZODone (DESYREL) tablet 50 mg (has no administration in time range)    ED Course  I have reviewed the triage vital signs and the nursing notes.  Pertinent lab results that were available during my care of the patient were reviewed by me and considered in my medical decision making (see chart for details).  MDM Rules/Calculators/A&P Depression with suicidal ideation but no specific plan.  Old records were reviewed, and he has no relevant past visits.  Will request TTS consultation.  Final Clinical Impression(s) / ED Diagnoses Final diagnoses:  Suicidal ideation  Alcohol intoxication, uncomplicated [facility])    Rx / DC Orders ED Discharge Orders    None       Dione Booze, MD 05/18/20 [phone]

## 2020-05-17 NOTE — ED Notes (Signed)
Pt wanded by security before dressing out. Pt changed into burgundy scrubs and belongings locked in locker room.

## 2020-05-18 LAB — RAPID URINE DRUG SCREEN, HOSP PERFORMED
Amphetamines: NOT DETECTED
Barbiturates: NOT DETECTED
Benzodiazepines: NOT DETECTED
Cocaine: POSITIVE — AB
Opiates: NOT DETECTED
Tetrahydrocannabinol: NOT DETECTED

## 2020-05-18 LAB — COMPREHENSIVE METABOLIC PANEL
ALT: 25 U/L (ref 0–44)
AST: 27 U/L (ref 15–41)
Albumin: 4.6 g/dL (ref 3.5–5.0)
Alkaline Phosphatase: 66 U/L (ref 38–126)
Anion gap: 11 (ref 5–15)
BUN: 11 mg/dL (ref 6–20)
CO2: 21 mmol/L — ABNORMAL LOW (ref 22–32)
Calcium: 8.7 mg/dL — ABNORMAL LOW (ref 8.9–10.3)
Chloride: 108 mmol/L (ref 98–111)
Creatinine, Ser: 0.81 mg/dL (ref 0.61–1.24)
GFR calc Af Amer: >60 mL/min (ref >60)
GFR calc non Af Amer: >60 mL/min (ref >60)
Glucose, Bld: 113 mg/dL — ABNORMAL HIGH (ref 70–99)
Potassium: 3.7 mmol/L (ref 3.5–5.1)
Sodium: 140 mmol/L (ref 135–145)
Total Bilirubin: 0.4 mg/dL (ref 0.3–1.2)
Total Protein: 8.1 g/dL (ref 6.5–8.1)

## 2020-05-18 LAB — CBC
HCT: 48 % (ref 39.0–52.0)
Hemoglobin: 16.4 g/dL (ref 13.0–17.0)
MCH: 31.4 pg (ref 26.0–34.0)
MCHC: 34.2 g/dL (ref 30.0–36.0)
MCV: 91.8 fL (ref 80.0–100.0)
Platelets: 276 10*3/uL (ref 150–400)
RBC: 5.23 MIL/uL (ref 4.22–5.81)
RDW: 13.1 % (ref 11.5–15.5)
WBC: 7.9 10*3/uL (ref 4.0–10.5)
nRBC: 0 % (ref 0.0–0.2)

## 2020-05-18 LAB — SALICYLATE LEVEL: Salicylate Lvl: <7 mg/dL — ABNORMAL LOW (ref 7.0–30.0)

## 2020-05-18 LAB — ACETAMINOPHEN LEVEL: Acetaminophen (Tylenol), Serum: <10 ug/mL — ABNORMAL LOW (ref 10–30)

## 2020-05-18 LAB — ETHANOL: Alcohol, Ethyl (B): 266 mg/dL — ABNORMAL HIGH (ref <10)

## 2020-05-18 NOTE — Discharge Instructions (Signed)
Follow up with out pt tx °

## 2020-05-18 NOTE — Progress Notes (Signed)
Patient ID: Brett Murray, male   DOB: November 29, 1985, 34 y.o.   MRN: [id number]   Psychiatric reassessment   HPI: Brett Murray is a 34 year old male who presents voluntary and accompanied to [facility]. Clinician asked the pt, "what brought you to the hospital?" Pt reported, he has a drug problem. Pt reported, he drinks a case of beer, daily. Pt reported, using a gram of cocaine two or three days ago. Pt reported, he has suicidal thoughts all day, everyday with no plan but has access to guns, knives. Pt was in the [employer] and had several tours in [place]. Pt reported, when coming down off alcohol he'll see something out of the corner of his eye. Pt denies, HI, self-injurious behaviors.   Pt reported, previous inpatient admissions. Pt's BAL and UDS are pending. Pt is linked to the [facility] in [place], [place] for medication management and counseling. Pt reported, he is prescribed, Trazodone, Wellbutrin and couple others the he can not remember. Pt reported, when drinking alcohol he does not take his medications. Pt reported, previous inpatient admissions, pt's most recent hospitalization was a year to a year and a half ago at the [place] in [place], [place].   Psychiatric evaluation: Brett Murray is a 34 year old male who presented to [facility] with concerns as noted above. During this evaluation, he was alert and oriented x4, calm and cooperative.He described current issues as alcohol addiction  And cocaine use. He stated that he has struggled with his alcohol addiction for many years stating the last time he drank was yesterday (a case of beer) and adding," I drink on a daily basis and I can't remember a day that I have went without drinking." He stated that he also drink liquor on occasion and when he drink alcohol, he often uses cocaine. State the last time he used cocaine was a couple of days ago. In regard to consequences of his alcohol disorder he stated that he at times he will  experience withdrawals and blackouts. Stated that  he has to go to court for a DUI that occurred  last year. He denied seizures. Stated that he sometimes sees things out he corner of his eyes when coming off of alcohol but denied hallucinations or DT's. Stated that he has been in numerous alcohol rehabilitation programs with the [facility] with most recent a year to a year and a half ago at the [facility] in [place], [place]. He denied current SI, HI and psychosis. Reported his biggest hope sis to get treatment for his substance abuse. Reported living alone and that there were firearms in the home. Gave verbal permission to speak to his Brett Murray for additional  information.   I spoke to  Brett Murray, [phone], who stated her husband and self took patient to the ED yesterday. She stated that patient does have a long history of alcohol addiction and patient voiced that he wanted to help so that's why he was taken tot he ED. Stated that patient is under care through the [facility]. We spoke spoke about guns being in the home and she stated," We talked about that and my husband is planning on handling that by going to remove the guns from his home later this afternoon." She had no other questions or concerns at this time.   Disposition: Patient denies current SI, HI and psychosis. He does endorse intermittent feelings of depression often secondary to his alcohol addiction .Patient reports a long history of alcohol  dependency and voices the desire  for inpatient rehab. He states that he prefers to go to the [facility] however, he is open to other treatment facilities. Patient denies current withdrawal symptoms although notes some anxiety. At this time, No evidence of imminent risk to self or others at present, patient does not meet criteria for psychiatric inpatient admission, as such, he is psychiatrically cleared.  Reccommended to continue follow-up with his outpatient team.. I will ask social work to see about available beds at the [facility] and if unavailable,  I  will request other options to be reviewed for inpatient substance abuse treatment. Patient is linked to [place] in [place], [place] for medication management and counseling.altghough states since the pandemic, he has not had any sessions or follow-up. He was encouraged to re-establish service for ongoing counseling and mental health treatment.    ED updated on disposition

## 2020-05-18 NOTE — BH Assessment (Addendum)
Per Denzil Magnuson, NP, patient recommended for discharge pending placement at a residential facility. However, prior to this counselor seeking possible residential programs patient was discharged. Per Denzil Magnuson, NP, his aunt--Nancy Kandis Nab [phone] called with concerns about patient's discharge without being placed in a residential program.   Writer did contact patient and aunt. Discussed with them programs in the community that would meet patient's needs: [facility] and [facility]. The aunt was given contact information for these two facilities.   The aunt shared that she would also like patient to go to the [facility] [place]. Clinician informed the aunt that the [facility] and [facility] has beds available.    The aunt was thankful for this clinician providing her with referrals for patient. Also, states that she will follow up with these referrals on patient's behalf.

## 2020-05-21 IMAGING — CT CT ABDOMEN AND PELVIS WITH CONTRAST
2 of 4 series · 16 of 46 positions shown, 18 images · IV contrast (APPLIED)
Comparison: Ultrasound from earlier in the same day.

CLINICAL DATA: Right upper quadrant pain for several hours

EXAM:
CT ABDOMEN AND PELVIS WITH CONTRAST
TECHNIQUE: Multidetector CT imaging of the abdomen and pelvis was performed
using the standard protocol following bolus administration of
intravenous contrast.
CONTRAST:  100mL OMNIPAQUE IOHEXOL 300 MG/ML  SOLN

[Series 2: routine abd/pel with · axial · 0.80mm/px · z∈[-276,+159]mm · 13 of 97 slices shown, 15 images]
[im 5/97  soft-tissue]
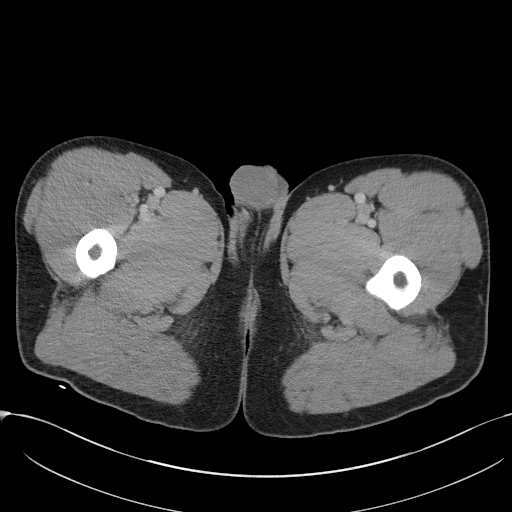
[im 5/97  bone]
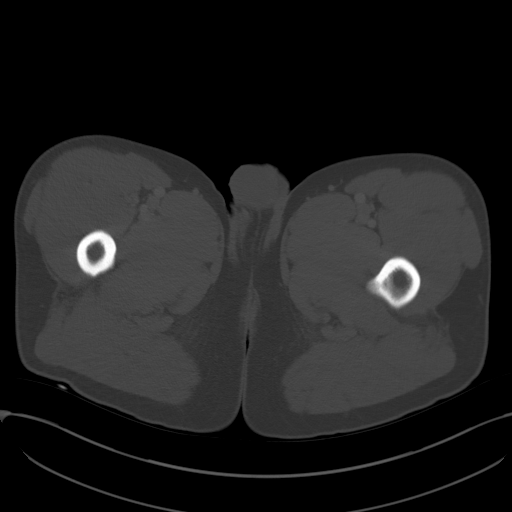
[im 14/97  soft-tissue]
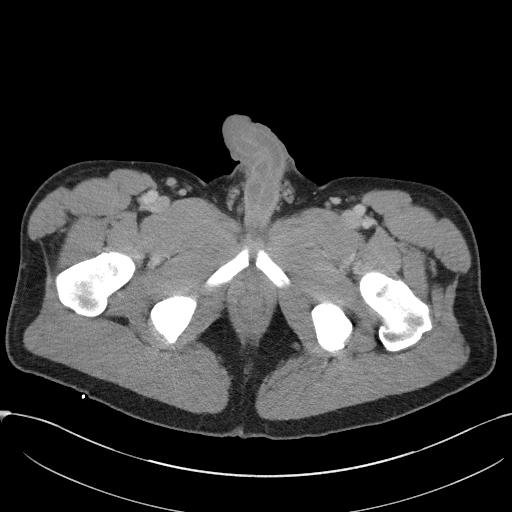
[im 19/97  soft-tissue]
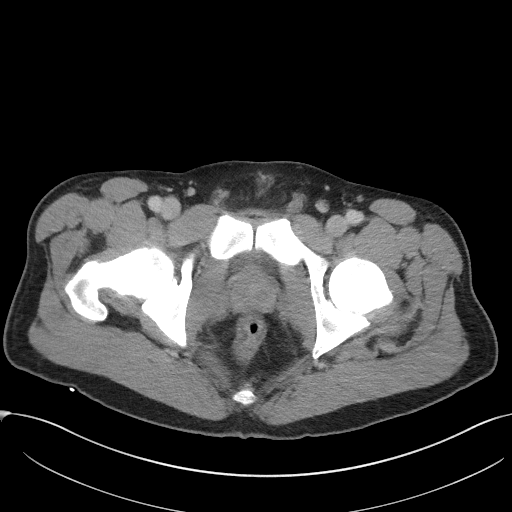
[im 28/97  soft-tissue]
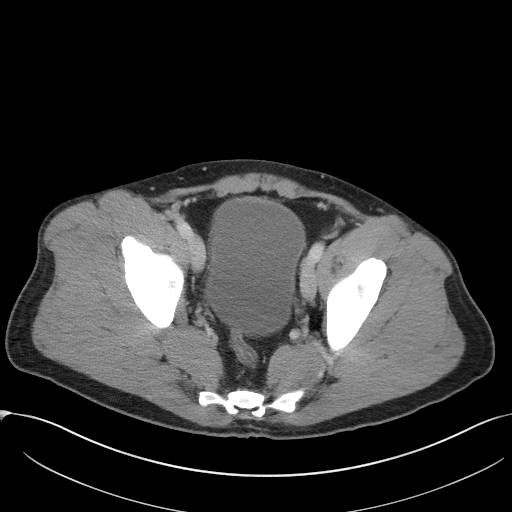
[im 33/97  soft-tissue]
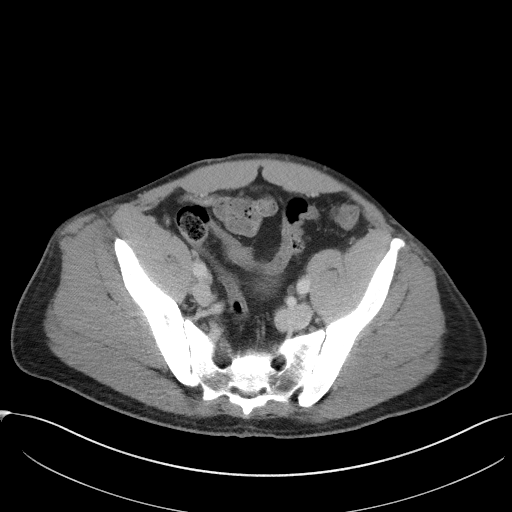
[im 42/97  soft-tissue]
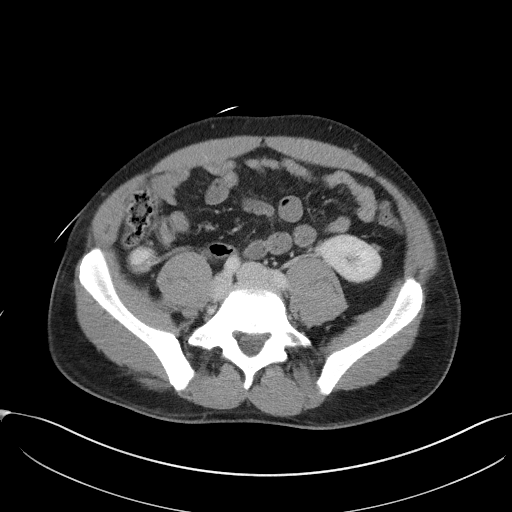
[im 51/97  soft-tissue]
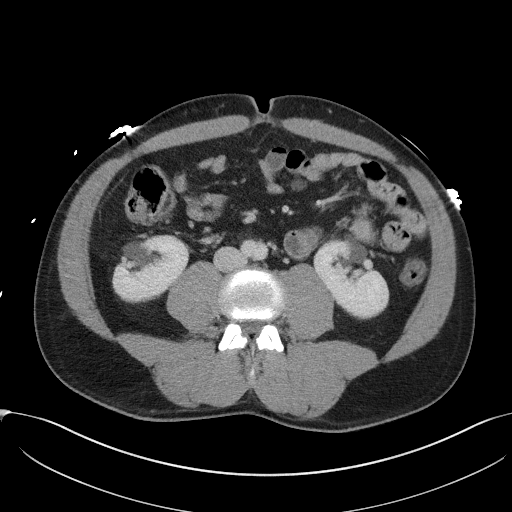
[im 55/97  soft-tissue]
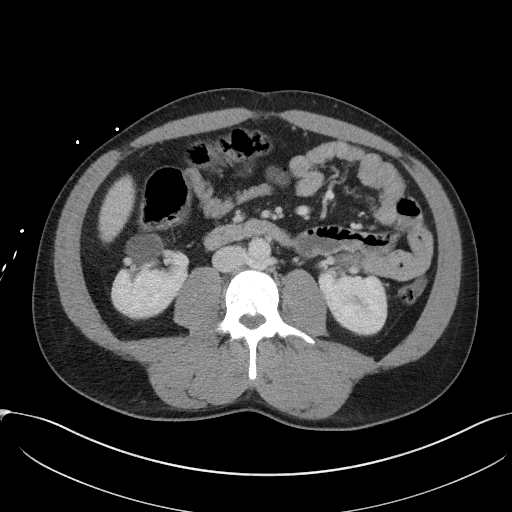
[im 65/97  soft-tissue]
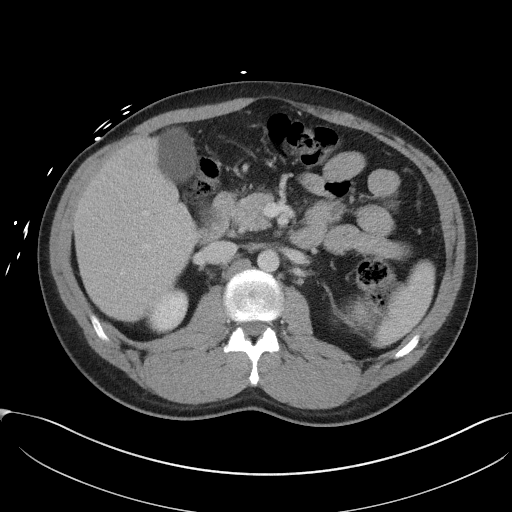
[im 65/97  bone]
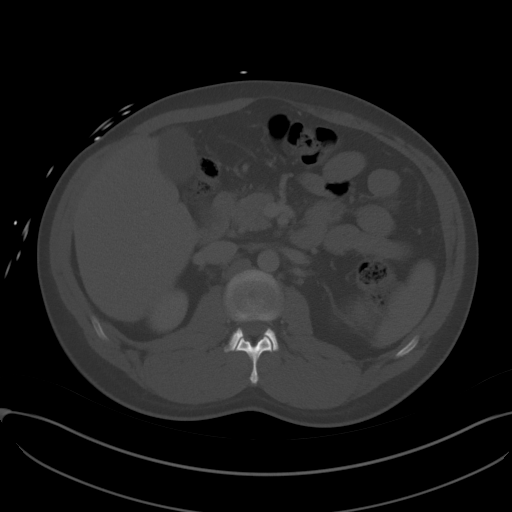
[im 69/97  soft-tissue]
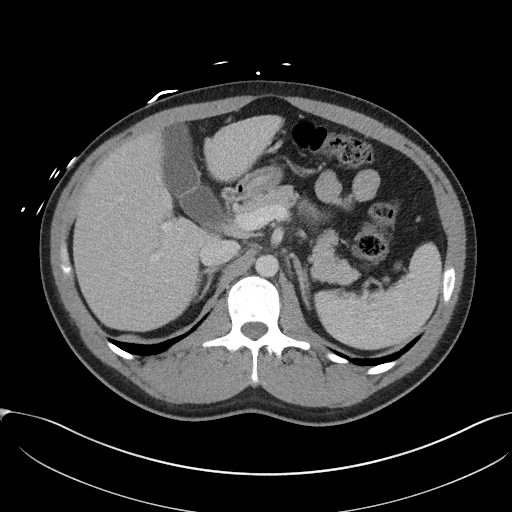
[im 78/97  soft-tissue]
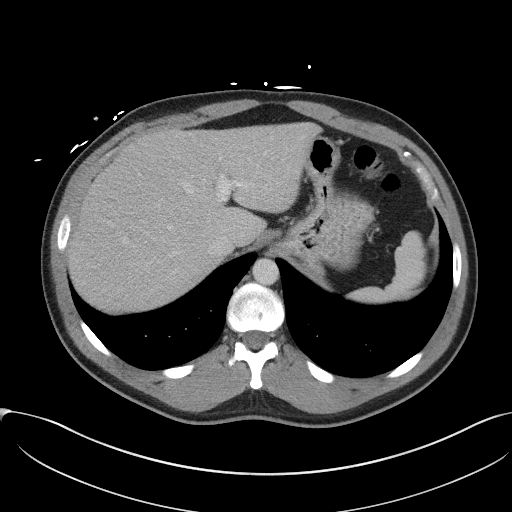
[im 83/97  soft-tissue]
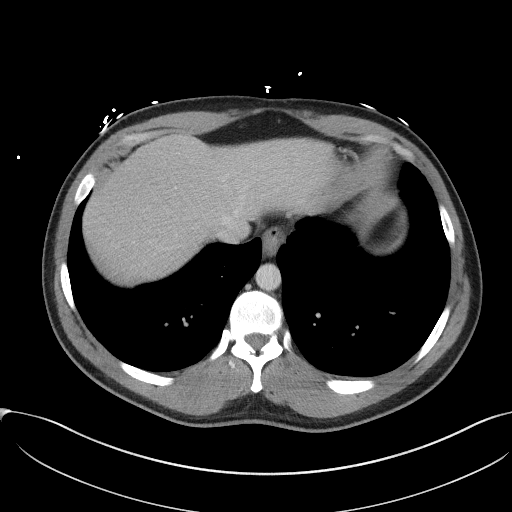
[im 92/97  soft-tissue]
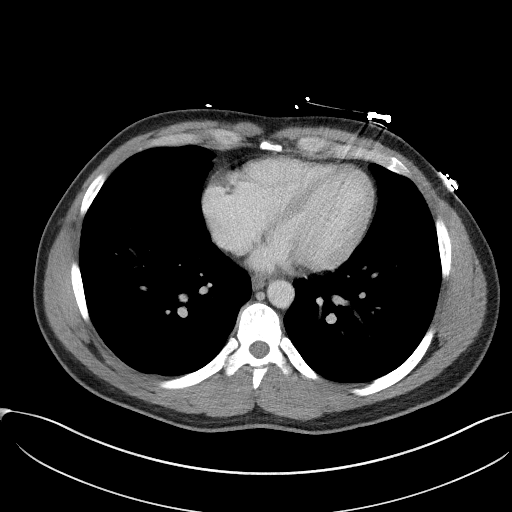

[Series 5: coronal st · coronal · 0.79mm/px · 3 of 94 slices shown]
[im 32/94  soft-tissue]
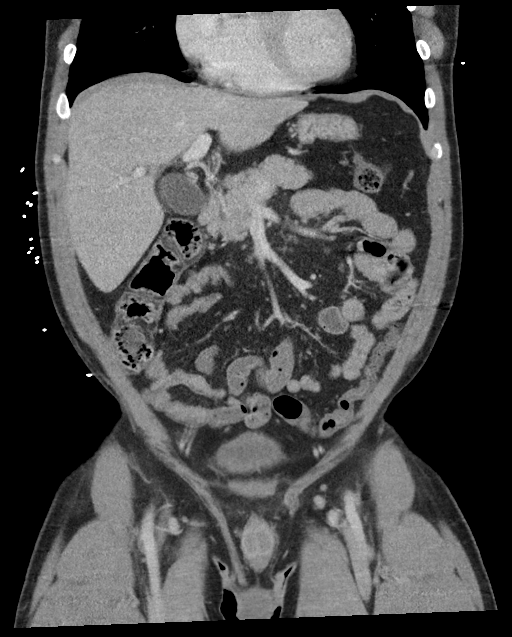
[im 42/94  soft-tissue]
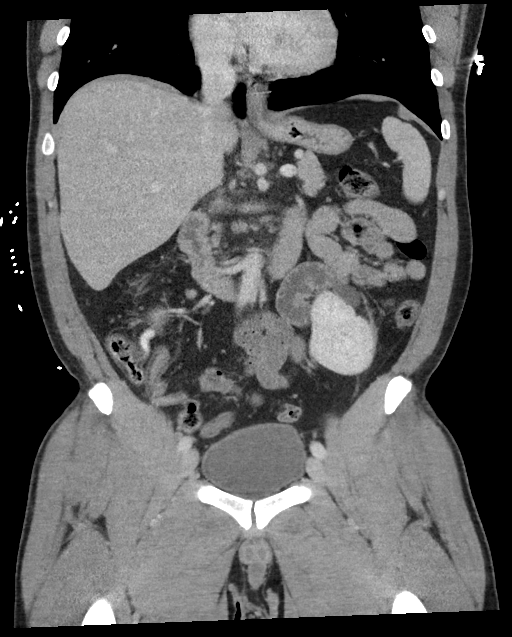
[im 52/94  soft-tissue]
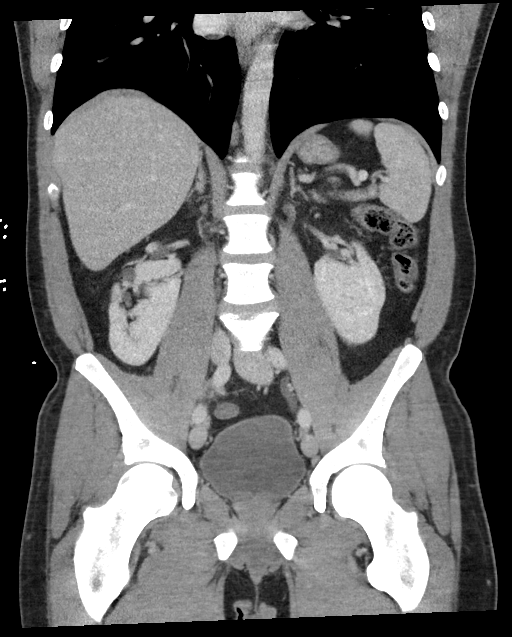

[16 of 46 positions shown; findings below may reference images not displayed]

FINDINGS: Lower chest: No acute abnormality.

Hepatobiliary: Liver is within normal limits. Gallbladder is well
distended with a single dependent gallstone. No biliary ductal
dilatation is seen.

Pancreas: Unremarkable. No pancreatic ductal dilatation or
surrounding inflammatory changes.

Spleen: Normal in size without focal abnormality.

Adrenals/Urinary Tract: Adrenal glands are within normal limits
bilaterally. Kidneys demonstrates some mild rotation defect with
antral laterally oriented collecting systems. No obstructive changes
are seen. Bladder is within normal limits.

Stomach/Bowel: Stomach is within normal limits. Appendix appears
normal. No evidence of bowel wall thickening, distention, or
inflammatory changes.

Vascular/Lymphatic: No significant vascular findings are present. No
enlarged abdominal or pelvic lymph nodes.

Reproductive: Prostate is unremarkable.

Other: No abdominal wall hernia or abnormality. No abdominopelvic
ascites.

Musculoskeletal: No acute or significant osseous findings.
IMPRESSION: Gallstone without complicating factors.

No other focal abnormality is noted.

## 2022-08-15 ENCOUNTER — Encounter: Payer: Self-pay | Admitting: Internal Medicine

## 2022-09-13 ENCOUNTER — Encounter: Payer: Self-pay | Admitting: *Deleted

## 2022-09-13 ENCOUNTER — Encounter: Payer: Self-pay | Admitting: Gastroenterology

## 2022-09-13 ENCOUNTER — Telehealth: Payer: Self-pay | Admitting: *Deleted

## 2022-09-13 ENCOUNTER — Ambulatory Visit (INDEPENDENT_AMBULATORY_CARE_PROVIDER_SITE_OTHER): Payer: No Typology Code available for payment source | Admitting: Gastroenterology

## 2022-09-13 VITALS — BP 123/81 | HR 82 | Temp 97.3°F | Ht 67.0 in | Wt 191.4 lb

## 2022-09-13 DIAGNOSIS — R1310 Dysphagia, unspecified: Secondary | ICD-10-CM | POA: Insufficient documentation

## 2022-09-13 DIAGNOSIS — K219 Gastro-esophageal reflux disease without esophagitis: Secondary | ICD-10-CM | POA: Diagnosis not present

## 2022-09-13 DIAGNOSIS — R1011 Right upper quadrant pain: Secondary | ICD-10-CM

## 2022-09-13 DIAGNOSIS — K529 Noninfective gastroenteritis and colitis, unspecified: Secondary | ICD-10-CM | POA: Diagnosis not present

## 2022-09-13 MED ORDER — PEG 3350-KCL-NA BICARB-NACL 420 G PO SOLR: 4000.0000 mL | Freq: Once | ORAL | 0 refills | Status: AC

## 2022-09-13 MED ORDER — DICYCLOMINE HCL 10 MG PO CAPS: 10.0000 mg | ORAL_CAPSULE | Freq: Three times a day (TID) | ORAL | 0 refills | Status: AC

## 2022-09-13 NOTE — Progress Notes (Signed)
Gastroenterology Office Note    Referring Provider: [facility] Primary Care Physician:  Asencion Noble, MD  Primary GI: Dr. Abbey Chatters     Chief Complaint   Chief Complaint  Patient presents with   New Patient (Initial Visit)    RUQ pain, gas, diarrhea and some constipation for years     History of Present Illness   DAJOUR BUFFONE is a 36 y.o. male presenting today in consultation as a new patient at the request of the [facility] due to abdominal pain, alternating constipation and diarrhea, history of rectal bleeding.    US abdomen complete  Sept 2023: cholelithiasis, possible adenomyomatosis CT abd/pelvis with IV contrast Sept 2023: 7 cm segment of wall thickening at mid transverse colon, dense material/calcification filling appendix, similar to prior.  CT abd/pelvis without contrast sept 2023: appendicoliths      5 year history of RUQ abdominal pain. Will have RUQ pain feels like going to die. Noted with cheesy/spicy foods. Recently eating popcorn and had several episodes. Postprandial. Will have small amount of pressure then radiates to back.   Will have alternating constipation and diarrhea. Will have liquids then sometimes straining. Lots of gas. Straining will cause low volume bleeding. Present many years.   No unexplained weight loss. Chronic GERD. Takes omeprazole daily. No dysphagia unless eating rice too fast. Will have to force down with water.   No prior colonoscopy. No prior EGD. No FH colon cancer, colon polyps. Maternal grandmother Crohn's.    Past Medical History:  Diagnosis Date   Anxiety    Depression    GERD (gastroesophageal reflux disease)    Hypertension    PTSD (post-traumatic stress disorder)     Past Surgical History:  Procedure Laterality Date   HERNIA REPAIR     umbilical    Current Outpatient Medications  Medication Sig Dispense Refill   buPROPion (WELLBUTRIN XL) 300 MG 24 hr tablet Take 300 mg by mouth daily.     dicyclomine  (BENTYL) 10 MG capsule Take 1 capsule (10 mg total) by mouth 4 (four) times daily -  before meals and at bedtime. For loose stools (Patient taking differently: Take 10 mg by mouth in the morning and at bedtime. For loose stools) 120 capsule 0   lisinopril (ZESTRIL) 10 MG tablet Take 5 mg by mouth daily.     omeprazole (PRILOSEC) 20 MG capsule Take 20 mg by mouth daily as needed (acid reflux).     traZODone (DESYREL) 100 MG tablet Take 100 mg by mouth at bedtime.     ascorbic acid (VITAMIN C) 500 MG tablet Take 500 mg by mouth daily.     No current facility-administered medications for this visit.    Allergies as of 09/13/2022   (No Known Allergies)    Family History  Problem Relation Age of Onset   Colon cancer Neg Hx    Colon polyps Neg Hx     Social History   Socioeconomic History   Marital status: Single    Spouse name: Not on file   Number of children: Not on file   Years of education: Not on file   Highest education level: Not on file  Occupational History   Not on file  Tobacco Use   Smoking status: Never   Smokeless tobacco: Never  Substance and Sexual Activity   Alcohol use: Yes    Alcohol/week: 24.0 standard drinks of alcohol    Types: 24 Cans of beer per week  Comment: As of Nov 2023: no alcohol in 3 months. would drink up to a case most days a week.   Drug use: Yes    Types: Cocaine    Comment: last in 3 months as of Nov 2023   Sexual activity: Not on file  Other Topics Concern   Not on file  Social History Narrative   Not on file   Social Determinants of Health   Financial Resource Strain: Not on file  Food Insecurity: Not on file  Transportation Needs: Not on file  Physical Activity: Not on file  Stress: Not on file  Social Connections: Not on file  Intimate Partner Violence: Not on file     Review of Systems   Gen: Denies any fever, chills, fatigue, weight loss, lack of appetite.  CV: Denies chest pain, heart palpitations, peripheral edema,  syncope.  Resp: Denies shortness of breath at rest or with exertion. Denies wheezing or cough.  GI: see HPI GU : Denies urinary burning, urinary frequency, urinary hesitancy MS: Denies joint pain, muscle weakness, cramps, or limitation of movement.  Derm: Denies rash, itching, dry skin Psych: Denies depression, anxiety, memory loss, and confusion Heme: Denies bruising, bleeding, and enlarged lymph nodes.   Physical Exam   BP 123/81   Pulse 82   Temp (!) 97.3 F (36.3 C)   Ht 5\' 7"  (1.702 m)   Wt 191 lb 6.4 oz (86.8 kg)   BMI 29.98 kg/m  General:   Alert and oriented. Pleasant and cooperative. Well-nourished and well-developed.  Head:  Normocephalic and atraumatic. Eyes:  Without icterus Ears:  Normal auditory acuity. Lungs:  Clear to auscultation bilaterally.  Heart:  S1, S2 present without murmurs appreciated.  Abdomen:  +BS, soft, TTP RUQ and non-distended. No HSM noted. No guarding or rebound. No masses appreciated.  Rectal:  Deferred  Msk:  Symmetrical without gross deformities. Normal posture. Extremities:  Without edema. Neurologic:  Alert and  oriented x4;  grossly normal neurologically. Skin:  Intact without significant lesions or rashes. Psych:  Alert and cooperative. Normal mood and affect.   Assessment   ZURICH CARRENO is a 36 y.o. male presenting today with 36 y.o. male presenting today in consultation as a new patient at the request of the [facility] due to abdominal pain, alternating constipation and diarrhea, history of rectal bleeding.   Abdominal pain: suspected biliary component, noting 5 year history RUQ abdominal pain worsened postprandially. [place] Sept 2023 with cholelithiasis, possible adenomyomatosis. HIDA scanned ordered.   Chronic GERD; on omeprazole daily. Notes solid food dysphagia with rice. EGD/dilation at time of colonoscopy.  Alternating bowel habits: constipation and diarrhea. Intermittent low-volume rectal bleeding in setting of straining.  CT abd/pelvis with IV contrast Sept 2023 noting 7 cm segment of wall thickening at mid transverse colon. Colonoscopy planned for diagnostic purposes. No prior colonoscopy. No FH colon cancer or polyps. Maternal grandmother did have Crohn's.    PLAN   HIDA scan  Proceed with colonoscopy/EGD/dilation by Dr. 07-27-2002  in near future: the risks, benefits, and alternatives have been discussed with the patient in detail. The patient states understanding and desires to proceed.   Dicyclomine prn looser stools: consider Xifaxan in future after colonoscopy  Continue omeprazole daily; may need to change therapy/escalate depending on EGD findings  Return in 3 months  Marletta Lor, PhD, [facility]

## 2022-09-13 NOTE — Telephone Encounter (Signed)
[facility]   HIDA scan scheduled for 09/15/22, Thursday at 10:00 am, arrive at 9:45 am

## 2022-09-13 NOTE — Patient Instructions (Signed)
Continue omeprazole daily, 30 minutes before breakfast as it's best absorbed this way.  For days you have looser stool: you can take dicyclomine before meals up to 4 times a day. This can cause constipation, dizziness, dry mouth. Only take if needed.  We are arranging a special scan of your gallbladder.  We are also arranging a colonoscopy/endoscopy/dilation by Dr. Marletta Lor in the near future!  I will see you in follow-up thereafter.  It was a pleasure to see you today. I want to create trusting relationships with patients to provide genuine, compassionate, and quality care. I value your feedback. If you receive a survey regarding your visit,  I greatly appreciate you taking time to fill this out.   Gelene Mink, PhD, ANP-BC [facility] Gastroenterology

## 2022-09-14 NOTE — Telephone Encounter (Signed)
Pt informed of procedure date and time. Verbalized understanding 

## 2022-09-14 NOTE — Telephone Encounter (Signed)
LMTRC

## 2022-09-15 ENCOUNTER — Encounter (HOSPITAL_COMMUNITY)
Admission: RE | Admit: 2022-09-15 | Discharge: 2022-09-15 | Disposition: A | Discharge status: Home / Self Care | Payer: No Typology Code available for payment source | Source: Ambulatory Visit | Attending: Gastroenterology | Admitting: Gastroenterology

## 2022-09-15 DIAGNOSIS — R1011 Right upper quadrant pain: Secondary | ICD-10-CM | POA: Diagnosis present

## 2022-09-15 MED ORDER — TECHNETIUM TC 99M MEBROFENIN IV KIT
5.0000 | PACK | Freq: Once | INTRAVENOUS | Status: AC | PRN
  Administered 2022-09-15: 4.95 via INTRAVENOUS

## 2022-09-20 ENCOUNTER — Other Ambulatory Visit: Payer: Self-pay | Admitting: *Deleted

## 2022-09-20 DIAGNOSIS — R1011 Right upper quadrant pain: Secondary | ICD-10-CM

## 2022-10-03 ENCOUNTER — Telehealth: Payer: Self-pay | Admitting: *Deleted

## 2022-10-03 ENCOUNTER — Encounter: Payer: Self-pay | Admitting: *Deleted

## 2022-10-03 NOTE — Telephone Encounter (Signed)
Left vm that if pt hadn't started clear liquids yesterday, then he would need to reschedule his procedure in the morning and to give me a call

## 2022-10-03 NOTE — Telephone Encounter (Signed)
Pt called and stated that he didn't get his instructions in the mail and he had called someone at the hospital and they told him what to do. Instructions sent to pt via MyChart.

## 2022-10-04 ENCOUNTER — Encounter: Payer: Self-pay | Admitting: *Deleted

## 2022-10-04 DIAGNOSIS — K529 Noninfective gastroenteritis and colitis, unspecified: Secondary | ICD-10-CM

## 2022-10-04 DIAGNOSIS — R1310 Dysphagia, unspecified: Secondary | ICD-10-CM

## 2022-10-04 DIAGNOSIS — R1011 Right upper quadrant pain: Secondary | ICD-10-CM

## 2022-10-04 MED ORDER — PEG 3350-KCL-NA BICARB-NACL 420 G PO SOLR: 4000.0000 mL | Freq: Once | ORAL | 0 refills | Status: AC

## 2022-10-04 NOTE — Telephone Encounter (Signed)
Pt called back and rescheduled for 11/15/22 at 10 am. New instructions sent to pt via MyChart

## 2022-10-04 NOTE — Telephone Encounter (Signed)
No, he didn't. I had left a vm on his phone that if he hadn't started a clear liquid diet the day before then he would need to reschedule. I rescheduled him until 11/08/22 with Dr. Marletta Lor

## 2022-10-04 NOTE — Telephone Encounter (Signed)
Brett Murray, did patient complete procedures? Please have patient rescheduled. Does not need office visit.

## 2022-11-08 ENCOUNTER — Encounter: Payer: Self-pay | Admitting: Internal Medicine

## 2022-11-15 ENCOUNTER — Other Ambulatory Visit: Payer: Self-pay

## 2022-11-15 ENCOUNTER — Encounter (HOSPITAL_COMMUNITY): Payer: Self-pay

## 2022-11-15 ENCOUNTER — Encounter (HOSPITAL_COMMUNITY)
Admission: RE | Disposition: A | Discharge status: Home / Self Care | Payer: Self-pay | Source: Home / Self Care | Attending: Internal Medicine

## 2022-11-15 ENCOUNTER — Ambulatory Visit (HOSPITAL_COMMUNITY): Payer: No Typology Code available for payment source | Admitting: Anesthesiology

## 2022-11-15 ENCOUNTER — Ambulatory Visit (HOSPITAL_COMMUNITY)
Admission: RE | Admit: 2022-11-15 | Discharge: 2022-11-15 | Disposition: A | Discharge status: Home / Self Care | Payer: No Typology Code available for payment source | Attending: Internal Medicine | Admitting: Internal Medicine

## 2022-11-15 DIAGNOSIS — K222 Esophageal obstruction: Secondary | ICD-10-CM | POA: Diagnosis not present

## 2022-11-15 DIAGNOSIS — F419 Anxiety disorder, unspecified: Secondary | ICD-10-CM | POA: Diagnosis not present

## 2022-11-15 DIAGNOSIS — F32A Depression, unspecified: Secondary | ICD-10-CM | POA: Diagnosis not present

## 2022-11-15 DIAGNOSIS — K648 Other hemorrhoids: Secondary | ICD-10-CM | POA: Insufficient documentation

## 2022-11-15 DIAGNOSIS — R12 Heartburn: Secondary | ICD-10-CM | POA: Diagnosis not present

## 2022-11-15 DIAGNOSIS — K219 Gastro-esophageal reflux disease without esophagitis: Secondary | ICD-10-CM | POA: Insufficient documentation

## 2022-11-15 DIAGNOSIS — R194 Change in bowel habit: Secondary | ICD-10-CM

## 2022-11-15 DIAGNOSIS — I10 Essential (primary) hypertension: Secondary | ICD-10-CM | POA: Diagnosis not present

## 2022-11-15 DIAGNOSIS — K2970 Gastritis, unspecified, without bleeding: Secondary | ICD-10-CM | POA: Insufficient documentation

## 2022-11-15 DIAGNOSIS — K529 Noninfective gastroenteritis and colitis, unspecified: Secondary | ICD-10-CM

## 2022-11-15 DIAGNOSIS — R1310 Dysphagia, unspecified: Secondary | ICD-10-CM | POA: Diagnosis not present

## 2022-11-15 DIAGNOSIS — K649 Unspecified hemorrhoids: Secondary | ICD-10-CM

## 2022-11-15 DIAGNOSIS — K625 Hemorrhage of anus and rectum: Secondary | ICD-10-CM | POA: Diagnosis not present

## 2022-11-15 DIAGNOSIS — R1011 Right upper quadrant pain: Secondary | ICD-10-CM

## 2022-11-15 HISTORY — PX: ESOPHAGOGASTRODUODENOSCOPY (EGD) WITH PROPOFOL: SHX5813

## 2022-11-15 HISTORY — PX: COLONOSCOPY WITH PROPOFOL: SHX5780

## 2022-11-15 HISTORY — PX: BIOPSY: SHX5522

## 2022-11-15 HISTORY — PX: BALLOON DILATION: SHX5330

## 2022-11-15 SURGERY — COLONOSCOPY WITH PROPOFOL
Anesthesia: General

## 2022-11-15 MED ORDER — OMEPRAZOLE 20 MG PO CPDR: 20.0000 mg | DELAYED_RELEASE_CAPSULE | Freq: Two times a day (BID) | ORAL | 5 refills | Status: DC

## 2022-11-15 MED ORDER — PROPOFOL 10 MG/ML IV BOLUS
INTRAVENOUS | Status: AC
  Filled 2022-11-15: qty 20

## 2022-11-15 MED ORDER — PANTOPRAZOLE SODIUM 40 MG PO TBEC: 40.0000 mg | DELAYED_RELEASE_TABLET | Freq: Two times a day (BID) | ORAL | 11 refills | Status: AC

## 2022-11-15 MED ORDER — LACTATED RINGERS IV SOLN
INTRAVENOUS | Status: DC
  Administered 2022-11-15: 1000 mL via INTRAVENOUS

## 2022-11-15 MED ORDER — MIDAZOLAM HCL 2 MG/2ML IJ SOLN
INTRAMUSCULAR | Status: AC
  Filled 2022-11-15: qty 2

## 2022-11-15 MED ORDER — LIDOCAINE HCL (PF) 2 % IJ SOLN
INTRAMUSCULAR | Status: AC
  Filled 2022-11-15: qty 5

## 2022-11-15 MED ORDER — PROPOFOL 500 MG/50ML IV EMUL
INTRAVENOUS | Status: AC
  Filled 2022-11-15: qty 50

## 2022-11-15 MED ORDER — PROPOFOL 10 MG/ML IV BOLUS
INTRAVENOUS | Status: DC | PRN
  Administered 2022-11-15 (×2): 100 mg via INTRAVENOUS

## 2022-11-15 MED ORDER — STERILE WATER FOR IRRIGATION IR SOLN
Status: DC | PRN
  Administered 2022-11-15: 50 mL

## 2022-11-15 MED ORDER — MIDAZOLAM HCL 2 MG/2ML IJ SOLN
INTRAMUSCULAR | Status: DC | PRN
  Administered 2022-11-15: 2 mg via INTRAVENOUS

## 2022-11-15 MED ORDER — PROPOFOL 500 MG/50ML IV EMUL
INTRAVENOUS | Status: DC | PRN
  Administered 2022-11-15: 200 ug/kg/min via INTRAVENOUS

## 2022-11-15 MED ORDER — LIDOCAINE HCL (CARDIAC) PF 100 MG/5ML IV SOSY
PREFILLED_SYRINGE | INTRAVENOUS | Status: DC | PRN
  Administered 2022-11-15: 50 mg via INTRAVENOUS

## 2022-11-15 NOTE — Discharge Instructions (Addendum)
EGD Discharge instructions Please read the instructions outlined below and refer to this sheet in the next few weeks. These discharge instructions provide you with general information on caring for yourself after you leave the hospital. Your doctor may also give you specific instructions. While your treatment has been planned according to the most current medical practices available, unavoidable complications occasionally occur. If you have any problems or questions after discharge, please call your doctor. ACTIVITY You may resume your regular activity but move at a slower pace for the next 24 hours.  Take frequent rest periods for the next 24 hours.  Walking will help expel (get rid of) the air and reduce the bloated feeling in your abdomen.  No driving for 24 hours (because of the anesthesia (medicine) used during the test).  You may shower.  Do not sign any important legal documents or operate any machinery for 24 hours (because of the anesthesia used during the test).  NUTRITION Drink plenty of fluids.  You may resume your normal diet.  Begin with a light meal and progress to your normal diet.  Avoid alcoholic beverages for 24 hours or as instructed by your caregiver.  MEDICATIONS You may resume your normal medications unless your caregiver tells you otherwise.  WHAT YOU CAN EXPECT TODAY You may experience abdominal discomfort such as a feeling of fullness or "gas" pains.  FOLLOW-UP Your doctor will discuss the results of your test with you.  SEEK IMMEDIATE MEDICAL ATTENTION IF ANY OF THE FOLLOWING OCCUR: Excessive nausea (feeling sick to your stomach) and/or vomiting.  Severe abdominal pain and distention (swelling).  Trouble swallowing.  Temperature over 101 F (37.8 C).  Rectal bleeding or vomiting of blood.    Colonoscopy Discharge Instructions  Read the instructions outlined below and refer to this sheet in the next few weeks. These discharge instructions provide you with  general information on caring for yourself after you leave the hospital. Your doctor may also give you specific instructions. While your treatment has been planned according to the most current medical practices available, unavoidable complications occasionally occur.   ACTIVITY You may resume your regular activity, but move at a slower pace for the next 24 hours.  Take frequent rest periods for the next 24 hours.  Walking will help get rid of the air and reduce the bloated feeling in your belly (abdomen).  No driving for 24 hours (because of the medicine (anesthesia) used during the test).   Do not sign any important legal documents or operate any machinery for 24 hours (because of the anesthesia used during the test).  NUTRITION Drink plenty of fluids.  You may resume your normal diet as instructed by your doctor.  Begin with a light meal and progress to your normal diet. Heavy or fried foods are harder to digest and may make you feel sick to your stomach (nauseated).  Avoid alcoholic beverages for 24 hours or as instructed.  MEDICATIONS You may resume your normal medications unless your doctor tells you otherwise.  WHAT YOU CAN EXPECT TODAY Some feelings of bloating in the abdomen.  Passage of more gas than usual.  Spotting of blood in your stool or on the toilet paper.  IF YOU HAD POLYPS REMOVED DURING THE COLONOSCOPY: No aspirin products for 7 days or as instructed.  No alcohol for 7 days or as instructed.  Eat a soft diet for the next 24 hours.  FINDING OUT THE RESULTS OF YOUR TEST Not all test results are available  during your visit. If your test results are not back during the visit, make an appointment with your caregiver to find out the results. Do not assume everything is normal if you have not heard from your caregiver or the medical facility. It is important for you to follow up on all of your test results.  SEEK IMMEDIATE MEDICAL ATTENTION IF: You have more than a spotting of  blood in your stool.  Your belly is swollen (abdominal distention).  You are nauseated or vomiting.  You have a temperature over 101.  You have abdominal pain or discomfort that is severe or gets worse throughout the day.   Your EGD revealed mild amount inflammation in your stomach.  I took biopsies of this to rule out infection with a bacteria called H. pylori.  Await pathology results, my office will contact you.  You had a mild tightening of your esophagus called a Schatzki's ring.  I did stretch this today.  Hopefully this helps with your swallowing.  I am going to change your omeprazole to pantoprazole 40 mg twice daily.  I have sent this to your pharmacy.  Overall your colon looked very healthy.  I did not see any active inflammation indicative of underlying inflammatory bowel disease such as Crohn's disease or ulcerative colitis.  No polyps or evidence of colon cancer.  The end portion of your small bowel also appeared normal.  You do have internal hemorrhoids which is likely the cause of your bleeding.  If this continues we can consider hemorrhoid banding in clinic.  Repeat colonoscopy in 10 years  Follow-up in 3 months.  I hope you have a great rest of your week!  Elon Alas. Abbey Chatters, D.O. Gastroenterology and Hepatology [facility]

## 2022-11-15 NOTE — H&P (Signed)
Primary Care Physician:  Asencion Noble, MD Primary Gastroenterologist:  Dr. Abbey Chatters  Pre-Procedure History & Physical: HPI:  Brett Murray is a 37 y.o. male is here for an EGD with possible dilation to be performed for GERD, RUQ pain, dysphagia and colonoscopy for rectal bleeding, change in bowel habits.   Past Medical History:  Diagnosis Date   Anxiety    Depression    GERD (gastroesophageal reflux disease)    Hypertension    PTSD (post-traumatic stress disorder)     Past Surgical History:  Procedure Laterality Date   HERNIA REPAIR     umbilical    Prior to Admission medications   Medication Sig Start Date End Date Taking? Authorizing Provider  ascorbic acid (VITAMIN C) 500 MG tablet Take 500 mg by mouth daily.   Yes [provider]  buPROPion (WELLBUTRIN XL) 300 MG 24 hr tablet Take 300 mg by mouth daily.   Yes [provider]  dicyclomine (BENTYL) 10 MG capsule Take 1 capsule (10 mg total) by mouth 4 (four) times daily -  before meals and at bedtime. For loose stools Patient taking differently: Take 10 mg by mouth in the morning and at bedtime. For loose stools 09/13/22  Yes Annitta Needs, NP  lisinopril (ZESTRIL) 10 MG tablet Take 5 mg by mouth daily.   Yes [provider]  omeprazole (PRILOSEC) 20 MG capsule Take 20 mg by mouth daily as needed (acid reflux).   Yes [provider]  traZODone (DESYREL) 100 MG tablet Take 100 mg by mouth at bedtime.   Yes [provider]    Allergies as of 09/13/2022   (No Known Allergies)    Family History  Problem Relation Age of Onset   Colon cancer Neg Hx    Colon polyps Neg Hx     Social History   Socioeconomic History   Marital status: Single    Spouse name: Not on file   Number of children: Not on file   Years of education: Not on file   Highest education level: Not on file  Occupational History   Not on file  Tobacco Use   Smoking status: Never   Smokeless tobacco: Never   Substance and Sexual Activity   Alcohol use: Yes    Alcohol/week: 24.0 standard drinks of alcohol    Types: 24 Cans of beer per week    Comment: As of Nov 2023: no alcohol in 3 months. would drink up to a case most days a week.   Drug use: Yes    Types: Cocaine    Comment: last in 3 months as of Nov 2023   Sexual activity: Not on file  Other Topics Concern   Not on file  Social History Narrative   Not on file   Social Determinants of Health   Financial Resource Strain: Not on file  Food Insecurity: Not on file  Transportation Needs: Not on file  Physical Activity: Not on file  Stress: Not on file  Social Connections: Not on file  Intimate Partner Violence: Not on file    Review of Systems: General: Negative for fever, chills, fatigue, weakness. Eyes: Negative for vision changes.  ENT: Negative for hoarseness, difficulty swallowing , nasal congestion. CV: Negative for chest pain, angina, palpitations, dyspnea on exertion, peripheral edema.  Respiratory: Negative for dyspnea at rest, dyspnea on exertion, cough, sputum, wheezing.  GI: See history of present illness. GU:  Negative for dysuria, hematuria, urinary incontinence, urinary frequency, nocturnal  urination.  MS: Negative for joint pain, low back pain.  Derm: Negative for rash or itching.  Neuro: Negative for weakness, abnormal sensation, seizure, frequent headaches, memory loss, confusion.  Psych: Negative for anxiety, depression Endo: Negative for unusual weight change.  Heme: Negative for bruising or bleeding. Allergy: Negative for rash or hives.  Physical Exam: Vital signs in last 24 hours: Temp:  [98.2 F (36.8 C)] 98.2 F (36.8 C) (01/16 0701) Pulse Rate:  [65] 65 (01/16 0701) Resp:  [12] 12 (01/16 0701) BP: (111)/(78) 111/78 (01/16 0701) SpO2:  [96 %] 96 % (01/16 0701) Weight:  [83.9 kg] 83.9 kg (01/16 0701)   General:   Alert,  Well-developed, well-nourished, pleasant and cooperative in NAD Head:   Normocephalic and atraumatic. Eyes:  Sclera clear, no icterus.   Conjunctiva pink. Ears:  Normal auditory acuity. Nose:  No deformity, discharge,  or lesions. Msk:  Symmetrical without gross deformities. Normal posture. Extremities:  Without clubbing or edema. Neurologic:  Alert and  oriented x4;  grossly normal neurologically. Skin:  Intact without significant lesions or rashes. Psych:  Alert and cooperative. Normal mood and affect.   Impression/Plan: Brett Murray is here for an EGD with possible dilation to be performed for GERD, RUQ pain, dysphagia and colonoscopy for rectal bleeding, change in bowel habits.   Risks, benefits, limitations, imponderables and alternatives regarding EGD have been reviewed with the patient. Questions have been answered. All parties agreeable.

## 2022-11-15 NOTE — Anesthesia Preprocedure Evaluation (Signed)
Anesthesia Evaluation  Patient identified by MRN, date of birth, ID band Patient awake    Reviewed: Allergy & Precautions, H&P , NPO status , Patient's Chart, lab work & pertinent test results, reviewed documented beta blocker date and time   Airway Mallampati: II  TM Distance: >3 FB Neck ROM: full    Dental no notable dental hx.    Pulmonary neg pulmonary ROS   Pulmonary exam normal breath sounds clear to auscultation       Cardiovascular Exercise Tolerance: Good hypertension, negative cardio ROS  Rhythm:regular Rate:Normal     Neuro/Psych  PSYCHIATRIC DISORDERS Anxiety Depression    negative neurological ROS  negative psych ROS   GI/Hepatic negative GI ROS, Neg liver ROS,GERD  ,,  Endo/Other  negative endocrine ROS    Renal/GU negative Renal ROS  negative genitourinary   Musculoskeletal   Abdominal   Peds  Hematology negative hematology ROS (+)   Anesthesia Other Findings   Reproductive/Obstetrics negative OB ROS                             Anesthesia Physical Anesthesia Plan  ASA: 2  Anesthesia Plan: General   Post-op Pain Management:    Induction:   PONV Risk Score and Plan: Propofol infusion  Airway Management Planned:   Additional Equipment:   Intra-op Plan:   Post-operative Plan:   Informed Consent: I have reviewed the patients History and Physical, chart, labs and discussed the procedure including the risks, benefits and alternatives for the proposed anesthesia with the patient or authorized representative who has indicated his/her understanding and acceptance.     Dental Advisory Given  Plan Discussed with: CRNA  Anesthesia Plan Comments:        Anesthesia Quick Evaluation

## 2022-11-15 NOTE — Transfer of Care (Signed)
Immediate Anesthesia Transfer of Care Note  Patient: Brett Murray  Procedure(s) Performed: COLONOSCOPY WITH PROPOFOL ESOPHAGOGASTRODUODENOSCOPY (EGD) WITH PROPOFOL BALLOON DILATION BIOPSY  Patient Location: Endoscopy Unit  Anesthesia Type:General  Level of Consciousness: drowsy  Airway & Oxygen Therapy: Patient Spontanous Breathing  Post-op Assessment: Report given to RN and Post -op Vital signs reviewed and stable  Post vital signs: Reviewed and stable  Last Vitals:  Vitals Value Taken Time  BP 96/62   Temp 36.3 C 11/15/22 0824  Pulse 86 11/15/22 0824  Resp 12 11/15/22 0824  SpO2 96 % 11/15/22 0824    Last Pain:  Vitals:   11/15/22 0824  TempSrc: Oral  PainSc:       Patients Stated Pain Goal: 6 (09/81/19 1478)  Complications: No notable events documented.

## 2022-11-15 NOTE — Op Note (Signed)
[facility] Patient Name: Brett Murray Procedure Date: 11/15/2022 7:17 AM MRN: [id number] Date of Birth: 1986/05/31 Attending MD: Elon Alas. Abbey Chatters , [place], [id number] CSN: [id number] Age: 37 Admit Type: Outpatient Procedure:                Colonoscopy Indications:              Rectal bleeding, Change in bowel habits Providers:                Elon Alas. Abbey Chatters, DO, Charlsie Quest. Theda Sers RN, RN,                            Illene Labrador Referring MD:              Medicines:                See the Anesthesia note for documentation of the                            administered medications Complications:             Estimated Blood Loss:     Estimated blood loss: none. Procedure:                Pre-Anesthesia Assessment:                           - The anesthesia plan was to use monitored                            anesthesia care (MAC).                           After obtaining informed consent, the colonoscope                            was passed under direct vision. Throughout the                            procedure, the patient's blood pressure, pulse, and                            oxygen saturations were monitored continuously. The                            [id number]-HQ190L ([id number]) scope was introduced through                            the anus and advanced to the the terminal ileum,                            with identification of the appendiceal orifice and                            IC valve. The colonoscopy was performed without                            difficulty. The patient tolerated the  procedure                            well. The quality of the bowel preparation was                            evaluated using the BBPS [facility] Bowel Preparation                            Scale) with scores of: Right Colon = 3, Transverse                            Colon = 3 and Left Colon = 3 (entire mucosa seen                            well with no residual staining, small  fragments of                            stool or opaque liquid). The total BBPS score                            equals 9. Scope In: 8:07:53 AM Scope Out: 8:21:44 AM Scope Withdrawal Time: 0 hours 11 minutes 35 seconds  Total Procedure Duration: 0 hours 13 minutes 51 seconds  Findings:      The perianal and digital rectal examinations were normal.      Non-bleeding internal hemorrhoids were found during retroflexion.      The terminal ileum appeared normal.      The entire examined colon appeared normal. Impression:               - Non-bleeding internal hemorrhoids.                           - The examined portion of the ileum was normal.                           - The entire examined colon is normal.                           - No specimens collected. Moderate Sedation:      Per Anesthesia Care Recommendation:           - Patient has a contact number available for                            emergencies. The signs and symptoms of potential                            delayed complications were discussed with the                            patient. Return to normal activities tomorrow.                            Written discharge instructions were provided to the  patient.                           - Resume previous diet.                           - Continue present medications.                           - Repeat colonoscopy in 10 years for screening                            purposes.                           - Return to GI clinic in 3 months. Consider                            hemorrhoid banding if bleeding continues Procedure Code(s):        --- Professional ---                           [phone], Colonoscopy, flexible; diagnostic, including                            collection of specimen(s) by brushing or washing,                            when performed (separate procedure) Diagnosis Code(s):        --- Professional ---                            K64.8, Other hemorrhoids                           K62.5, Hemorrhage of anus and rectum                           R19.4, Change in bowel habit CPT copyright 2022 American [employer] Association. All rights reserved. The codes documented in this report are preliminary and upon coder review may  be revised to meet current compliance requirements. Elon Alas. Abbey Chatters, DO Achille Abbey Chatters, DO 11/15/2022 8:25:58 AM This report has been signed electronically. Number of Addenda: 0

## 2022-11-15 NOTE — Op Note (Signed)
[facility] Patient Name: Brett Murray Procedure Date: 11/15/2022 7:11 AM MRN: [id number] Date of Birth: [birth year] Attending MD: Elon Alas. Abbey Chatters , [place], [id number] CSN: [id number] Age: 37 Admit Type: Outpatient Procedure:                Upper GI endoscopy Indications:              Abdominal pain in the right upper quadrant,                            Dysphagia, Heartburn Providers:                Elon Alas. Abbey Chatters, DO, Charlsie Quest. Theda Sers RN, RN,                            Illene Labrador Referring MD:              Medicines:                See the Anesthesia note for documentation of the                            administered medications Complications:            No immediate complications. Estimated Blood Loss:     Estimated blood loss was minimal. Procedure:                Pre-Anesthesia Assessment:                           - The anesthesia plan was to use monitored                            anesthesia care (MAC).                           After obtaining informed consent, the endoscope was                            passed under direct vision. Throughout the                            procedure, the patient's blood pressure, pulse, and                            oxygen saturations were monitored continuously. The                            [id number]-H190 ([id number]) scope was introduced through the                            mouth, and advanced to the second part of duodenum.                            The upper GI endoscopy was accomplished without                            difficulty. The patient  tolerated the procedure                            well. Scope In: 7:59:16 AM Scope Out: 8:03:34 AM Total Procedure Duration: 0 hours 4 minutes 18 seconds  Findings:      A mild Schatzki ring was found in the lower third of the esophagus. A       TTS dilator was passed through the scope. Dilation with an 18-19-20 mm       balloon dilator was performed to 20 mm. The dilation site  was examined       and showed mild mucosal disruption and moderate improvement in luminal       narrowing.      Patchy mild inflammation characterized by erythema was found in the       gastric body and in the gastric antrum. Biopsies were taken with a cold       forceps for Helicobacter pylori testing.      The duodenal bulb, first portion of the duodenum and second portion of       the duodenum were normal. Impression:               - Mild Schatzki ring. Dilated.                           - Gastritis. Biopsied.                           - Normal duodenal bulb, first portion of the                            duodenum and second portion of the duodenum. Moderate Sedation:      Per Anesthesia Care Recommendation:           - Patient has a contact number available for                            emergencies. The signs and symptoms of potential                            delayed complications were discussed with the                            patient. Return to normal activities tomorrow.                            Written discharge instructions were provided to the                            patient.                           - Continue present medications.                           - Await pathology results.                           - Repeat upper endoscopy PRN  for retreatment.                           - Return to GI clinic in 3 months.                           - Use a proton pump inhibitor PO BID for 12 weeks. Procedure Code(s):        --- Professional ---                           [id number], Esophagogastroduodenoscopy, flexible,                            transoral; with transendoscopic balloon dilation of                            esophagus (less than 30 mm diameter)                           43239, 59, Esophagogastroduodenoscopy, flexible,                            transoral; with biopsy, single or multiple Diagnosis Code(s):        --- Professional ---                            K22.2, Esophageal obstruction                           K29.70, Gastritis, unspecified, without bleeding                           R10.11, Right upper quadrant pain                           R13.10, Dysphagia, unspecified                           R12, Heartburn CPT copyright 2022 American Medical Association. All rights reserved. The codes documented in this report are preliminary and upon coder review may  be revised to meet current compliance requirements. Elon Alas. Abbey Chatters, DO Maud Abbey Chatters, DO 11/15/2022 8:24:03 AM This report has been signed electronically. Number of Addenda: 0

## 2022-11-16 LAB — SURGICAL PATHOLOGY

## 2022-11-16 NOTE — Anesthesia Postprocedure Evaluation (Signed)
Anesthesia Post Note  Patient: Brett Murray  Procedure(s) Performed: COLONOSCOPY WITH PROPOFOL ESOPHAGOGASTRODUODENOSCOPY (EGD) WITH PROPOFOL BALLOON DILATION BIOPSY  Patient location during evaluation: Phase II Anesthesia Type: General Level of consciousness: awake Pain management: pain level controlled Vital Signs Assessment: post-procedure vital signs reviewed and stable Respiratory status: spontaneous breathing and respiratory function stable Cardiovascular status: blood pressure returned to baseline and stable Postop Assessment: no headache and no apparent nausea or vomiting Anesthetic complications: no Comments: Late entry   No notable events documented.   Last Vitals:  Vitals:   11/15/22 0824 11/15/22 0825  BP:  96/62  Pulse: 86   Resp: 12   Temp: (!) 36.3 C   SpO2: 96%     Last Pain:  Vitals:   11/15/22 0825  TempSrc:   PainSc: 0-No pain                 Louann Sjogren

## 2022-11-21 ENCOUNTER — Encounter (HOSPITAL_COMMUNITY): Payer: Self-pay | Admitting: Internal Medicine

## 2023-12-25 NOTE — Progress Notes (Deleted)
 12/26/23- 37 yoM for sleep evaluation courtesy of Kristeen Mans, MD with concern of snoring Medical problem list includes HTN, GERD, Colitis, PTSD, Depression/ Anxiety Epworth score- Body weight today-

## 2023-12-26 ENCOUNTER — Institutional Professional Consult (permissible substitution): Payer: No Typology Code available for payment source | Admitting: Internal Medicine

## 2023-12-26 ENCOUNTER — Encounter: Payer: Self-pay | Admitting: Internal Medicine

## 2024-02-20 ENCOUNTER — Ambulatory Visit: Admitting: Adult Health

## 2024-04-23 ENCOUNTER — Ambulatory Visit: Admitting: Adult Health

## 2024-11-01 ENCOUNTER — Encounter (HOSPITAL_COMMUNITY)
Admission: RE | Admit: 2024-11-01 | Discharge: 2024-11-01 | Disposition: A | Discharge status: Home / Self Care | Source: Ambulatory Visit | Attending: Nurse Practitioner | Admitting: Nurse Practitioner

## 2024-11-01 DIAGNOSIS — I2510 Atherosclerotic heart disease of native coronary artery without angina pectoris: Secondary | ICD-10-CM | POA: Insufficient documentation

## 2024-11-01 DIAGNOSIS — I214 Non-ST elevation (NSTEMI) myocardial infarction: Secondary | ICD-10-CM | POA: Insufficient documentation

## 2024-11-01 NOTE — Progress Notes (Signed)
 Completed virtual orientation today.  EP evaluation is scheduled for 11/05/24 at 1:00 .  Documentation for diagnosis can be found in [facility] encounter 08/17/24.

## 2024-11-05 ENCOUNTER — Encounter (HOSPITAL_COMMUNITY)
Admission: RE | Admit: 2024-11-05 | Discharge: 2024-11-05 | Disposition: A | Source: Ambulatory Visit | Attending: Nurse Practitioner | Admitting: Nurse Practitioner

## 2024-11-05 VITALS — Ht 68.5 in | Wt 193.4 lb

## 2024-11-05 DIAGNOSIS — I2510 Atherosclerotic heart disease of native coronary artery without angina pectoris: Secondary | ICD-10-CM

## 2024-11-05 DIAGNOSIS — I214 Non-ST elevation (NSTEMI) myocardial infarction: Secondary | ICD-10-CM

## 2024-11-05 NOTE — Progress Notes (Signed)
 Cardiac Individual Treatment Plan  Patient Details  Name: Brett Murray MRN: [id number] Date of Birth: [birth year] Referring Provider:   Flowsheet Row CARDIAC REHAB PHASE II ORIENTATION from 11/05/2024 in [facility] CARDIAC REHABILITATION  Referring Provider Melba Sor NP ([place])  [facility] Cardiologist Dr. Marcello Lennox ([facility])]    Initial Encounter Date:  Flowsheet Row CARDIAC REHAB PHASE II ORIENTATION from 11/05/2024 in [facility] [place] CARDIAC REHABILITATION  Date 11/05/24    Visit Diagnosis: NSTEMI (non-ST elevated myocardial infarction) [facility])  Atherosclerosis of native coronary artery of native heart without angina pectoris  Patient's Home Medications on Admission: Current Medications[1]  Past Medical History: Past Medical History:  Diagnosis Date   Anxiety    Depression    GERD (gastroesophageal reflux disease)    Hypertension    PTSD (post-traumatic stress disorder)     Tobacco Use: Tobacco Use History[2]  Labs: Review Flowsheet        No data to display           Exercise Target Goals: Exercise Program Goal: Individual exercise prescription set using results from initial 6 min walk test and THRR while considering  patients activity barriers and safety.   Exercise Prescription Goal: Initial exercise prescription builds to 30-45 minutes a day of aerobic activity, 2-3 days per week.  Home exercise guidelines will be given to patient during program as part of exercise prescription that the participant will acknowledge.   Education: Aerobic Exercise: - Group verbal and visual presentation on the components of exercise prescription. Introduces F.I.T.T principle from ACSM for exercise prescriptions.  Reviews F.I.T.T. principles of aerobic exercise including progression. Written material provided at class time.   Education: Resistance Exercise: - Group verbal and visual presentation on the components of exercise prescription. Introduces F.I.T.T principle from ACSM  for exercise prescriptions  Reviews F.I.T.T. principles of resistance exercise including progression. Written material provided at class time.    Education: Exercise & Equipment Safety: - Individual verbal instruction and demonstration of equipment use and safety with use of the equipment.   Education: Exercise Physiology & General Exercise Guidelines: - Group verbal and written instruction with models to review the exercise physiology of the cardiovascular system and associated critical values. Provides general exercise guidelines with specific guidelines to those with heart or lung disease. Written material provided at class time.   Education: Flexibility, Balance, Mind/Body Relaxation: - Group verbal and visual presentation with interactive activity on the components of exercise prescription. Introduces F.I.T.T principle from ACSM for exercise prescriptions. Reviews F.I.T.T. principles of flexibility and balance exercise training including progression. Also discusses the mind body connection.  Reviews various relaxation techniques to help reduce and manage stress (i.e. Deep breathing, progressive muscle relaxation, and visualization). Balance handout provided to take home. Written material provided at class time.   Activity Barriers & Risk Stratification:  Activity Barriers & Cardiac Risk Stratification - 11/01/24 1027       Activity Barriers & Cardiac Risk Stratification   Activity Barriers Chest Pain/Angina;Deconditioning;Muscular Weakness;Shortness of Breath    Cardiac Risk Stratification Moderate          6 Minute Walk:  6 Minute Walk     Row Name 11/05/24 1431         6 Minute Walk   Phase Initial     Distance 1705 feet     Walk Time 6 minutes     # of Rest Breaks 0     MPH 3.23     METS 5.15  RPE 9     Perceived Dyspnea  2     VO2 Peak 18.03     Symptoms Yes (comment)     Comments SOB     Resting HR 69 bpm     Resting BP 118/66     Resting Oxygen Saturation   95 %     Exercise Oxygen Saturation  during 6 min walk 97 %     Max Ex. HR 101 bpm     Max Ex. BP 126/72     2 Minute Post BP 122/66        Oxygen Initial Assessment:   Oxygen Re-Evaluation:   Oxygen Discharge (Final Oxygen Re-Evaluation):   Initial Exercise Prescription:  Initial Exercise Prescription - 11/05/24 1400       Date of Initial Exercise RX and Referring Provider   Date 11/05/24    Referring Provider Melba Sor NP ([place])   Primary Cardiologist Dr. Marcello Lennox [facility])     Oxygen   Maintain Oxygen Saturation 88% or higher      Treadmill   MPH 3.2    Grade 2    Minutes 15    METs 4.33      Elliptical   Level 4    Speed 4    Minutes 15    METs 4.5      Prescription Details   Frequency (times per week) 3    Duration Progress to 30 minutes of continuous aerobic without signs/symptoms of physical distress      Intensity   THRR 40-80% of Max Heartrate 113-158    Ratings of Perceived Exertion 11-13    Perceived Dyspnea 0-4      Progression   Progression Continue to progress workloads to maintain intensity without signs/symptoms of physical distress.      Resistance Training   Training Prescription Yes    Weight 8 lb    Reps 10-15          Perform Capillary Blood Glucose checks as needed.  Exercise Prescription Changes:   Exercise Prescription Changes     Row Name 11/05/24 1400             Response to Exercise   Blood Pressure (Admit) 118/66       Blood Pressure (Exercise) 126/72       Blood Pressure (Exit) 122/66       Heart Rate (Admit) 69 bpm       Heart Rate (Exercise) 101 bpm       Heart Rate (Exit) 63 bpm       Oxygen Saturation (Admit) 95 %       Oxygen Saturation (Exercise) 97 %       Rating of Perceived Exertion (Exercise) 9       Perceived Dyspnea (Exercise) 2       Symptoms SOB       Comments walk test results          Exercise Comments:   Exercise Comments     Row Name 11/01/24 1032           Exercise  Comments States he doesn't do any exercise right now, but he has an active job.          Exercise Goals and Review:   Exercise Goals     Row Name 11/01/24 1032             Exercise Goals   Increase Physical Activity Yes       Intervention  Provide advice, education, support and counseling about physical activity/exercise needs.;Develop an individualized exercise prescription for aerobic and resistive training based on initial evaluation findings, risk stratification, comorbidities and participant's personal goals.       Expected Outcomes Short Term: Attend rehab on a regular basis to increase amount of physical activity.;Long Term: Add in home exercise to make exercise part of routine and to increase amount of physical activity.;Long Term: Exercising regularly at least 3-5 days a week.       Increase Strength and Stamina Yes       Intervention Develop an individualized exercise prescription for aerobic and resistive training based on initial evaluation findings, risk stratification, comorbidities and participant's personal goals.;Provide advice, education, support and counseling about physical activity/exercise needs.       Expected Outcomes Short Term: Increase workloads from initial exercise prescription for resistance, speed, and METs.;Short Term: Perform resistance training exercises routinely during rehab and add in resistance training at home;Long Term: Improve cardiorespiratory fitness, muscular endurance and strength as measured by increased METs and functional capacity ( )       Able to understand and use rate of perceived exertion (RPE) scale Yes       Intervention Provide education and explanation on how to use RPE scale       Expected Outcomes Short Term: Able to use RPE daily in rehab to express subjective intensity level;Long Term:  Able to use RPE to guide intensity level when exercising independently       Able to understand and use Dyspnea scale Yes       Intervention  Provide education and explanation on how to use Dyspnea scale       Expected Outcomes Long Term: Able to use Dyspnea scale to guide intensity level when exercising independently;Short Term: Able to use Dyspnea scale daily in rehab to express subjective sense of shortness of breath during exertion       Knowledge and understanding of Target Heart Rate Range (THRR) Yes       Intervention Provide education and explanation of THRR including how the numbers were predicted and where they are located for reference       Expected Outcomes Short Term: Able to state/look up THRR;Short Term: Able to use daily as guideline for intensity in rehab;Long Term: Able to use THRR to govern intensity when exercising independently       Able to check pulse independently Yes       Intervention Provide education and demonstration on how to check pulse in carotid and radial arteries.;Review the importance of being able to check your own pulse for safety during independent exercise       Expected Outcomes Short Term: Able to explain why pulse checking is important during independent exercise;Long Term: Able to check pulse independently and accurately       Understanding of Exercise Prescription Yes       Intervention Provide education, explanation, and written materials on patient's individual exercise prescription       Expected Outcomes Short Term: Able to explain program exercise prescription;Long Term: Able to explain home exercise prescription to exercise independently          Exercise Goals Re-Evaluation :   Discharge Exercise Prescription (Final Exercise Prescription Changes):  Exercise Prescription Changes - 11/05/24 1400       Response to Exercise   Blood Pressure (Admit) 118/66    Blood Pressure (Exercise) 126/72    Blood Pressure (Exit) 122/66    Heart Rate (Admit)  69 bpm    Heart Rate (Exercise) 101 bpm    Heart Rate (Exit) 63 bpm    Oxygen Saturation (Admit) 95 %    Oxygen Saturation (Exercise) 97  %    Rating of Perceived Exertion (Exercise) 9    Perceived Dyspnea (Exercise) 2    Symptoms SOB    Comments walk test results          Nutrition:  Target Goals: Understanding of nutrition guidelines, daily intake of sodium 1500mg , cholesterol 200mg , calories 30% from fat and 7% or less from saturated fats, daily to have 5 or more servings of fruits and vegetables.  Education: Nutrition 1 -Group instruction provided by verbal, written material, interactive activities, discussions, models, and posters to present general guidelines for heart healthy nutrition including macronutrients, label reading, and promoting whole foods over processed counterparts. Education serves as pensions consultant of discussion of heart healthy eating for all. Written material provided at class time.    Education: Nutrition 2 -Group instruction provided by verbal, written material, interactive activities, discussions, models, and posters to present general guidelines for heart healthy nutrition including sodium, cholesterol, and saturated fat. Providing guidance of habit forming to improve blood pressure, cholesterol, and body weight. Written material provided at class time.     Biometrics:  Pre Biometrics - 11/05/24 1437       Pre Biometrics   Height 5' 8.5 (1.74 m)    Weight 87.7 kg    Waist Circumference 36 inches    Hip Circumference 38 inches    Waist to Hip Ratio 0.95 %    BMI (Calculated) 28.98    Grip Strength 23.1 kg    Single Leg Stand 30 seconds           Nutrition Therapy Plan and Nutrition Goals:  Nutrition Therapy & Goals - 11/01/24 1033       Intervention Plan   Intervention Nutrition handout(s) given to patient.;Prescribe, educate and counsel regarding individualized specific dietary modifications aiming towards targeted core components such as weight, hypertension, lipid management, diabetes, heart failure and other comorbidities.    Expected Outcomes Long Term Goal: Adherence to  prescribed nutrition plan.;Short Term Goal: A plan has been developed with personal nutrition goals set during dietitian appointment.;Short Term Goal: Understand basic principles of dietary content, such as calories, fat, sodium, cholesterol and nutrients.          Nutrition Assessments:  MEDIFICTS Score Key: >=70 Need to make dietary changes  40-70 Heart Healthy Diet <= 40 Therapeutic Level Cholesterol Diet   Picture Your Plate Scores: <59 Unhealthy dietary pattern with much room for improvement. 41-50 Dietary pattern unlikely to meet recommendations for good health and room for improvement. 51-60 More healthful dietary pattern, with some room for improvement.  >60 Healthy dietary pattern, although there may be some specific behaviors that could be improved.    Nutrition Goals Re-Evaluation:   Nutrition Goals Discharge (Final Nutrition Goals Re-Evaluation):   Psychosocial: Target Goals: Acknowledge presence or absence of significant depression and/or stress, maximize coping skills, provide positive support system. Participant is able to verbalize types and ability to use techniques and skills needed for reducing stress and depression.   Education: Stress, Anxiety, and Depression - Group verbal and visual presentation to define topics covered.  Reviews how body is impacted by stress, anxiety, and depression.  Also discusses healthy ways to reduce stress and to treat/manage anxiety and depression. Written material provided at class time.   Education: Sleep Hygiene -Provides group verbal and  written instruction about how sleep can affect your health.  Define sleep hygiene, discuss sleep cycles and impact of sleep habits. Review good sleep hygiene tips.   Initial Review & Psychosocial Screening:  Initial Psych Review & Screening - 11/01/24 1033       Initial Review   Current issues with Current Psychotropic Meds;History of Depression;None Identified      Family Dynamics    Good Support System? Yes    Comments Has his aunt and uncle and girlfriend as a support system.      Barriers   Psychosocial barriers to participate in program There are no identifiable barriers or psychosocial needs.      Screening Interventions   Interventions Encouraged to exercise    Expected Outcomes Long Term goal: The participant improves quality of Life and PHQ9 Scores as seen by post scores and/or verbalization of changes;Short Term goal: Identification and review with participant of any Quality of Life or Depression concerns found by scoring the questionnaire.;Long Term Goal: Stressors or current issues are controlled or eliminated.;Short Term goal: Utilizing psychosocial counselor, staff and physician to assist with identification of specific Stressors or current issues interfering with healing process. Setting desired goal for each stressor or current issue identified.          Quality of Life Scores:   Scores of 19 and below usually indicate a poorer quality of life in these areas.  A difference of  2-3 points is a clinically meaningful difference.  A difference of 2-3 points in the total score of the Quality of Life Index has been associated with significant improvement in overall quality of life, self-image, physical symptoms, and general health in studies assessing change in quality of life.  PHQ-9: Review Flowsheet       11/05/2024  Depression screen PHQ 2/9  Decreased Interest 0  Down, Depressed, Hopeless 0  PHQ - 2 Score 0  Altered sleeping 0  Tired, decreased energy 1  Change in appetite 0  Feeling bad or failure about yourself  0  Trouble concentrating 0  Moving slowly or fidgety/restless 0  Suicidal thoughts 0  PHQ-9 Score 1  Difficult doing work/chores Not difficult at all   Interpretation of Total Score  Total Score Depression Severity:  1-4 = Minimal depression, 5-9 = Mild depression, 10-14 = Moderate depression, 15-19 = Moderately severe depression,  20-27 = Severe depression   Psychosocial Evaluation and Intervention:  Psychosocial Evaluation - 11/01/24 1034       Psychosocial Evaluation & Interventions   Interventions Encouraged to exercise with the program and follow exercise prescription    Comments Aven is a pleasant 39 year old coming into cardiac rehab for a NSTEMI back in October. He is employed at [employer] in [place]. He currently doesn't exercise but he states his job is an active job. His mobility is well and he does state that he gets a little SOB and chest pain with exertional activites. He has his aunt, uncle and girlfriend for support. He is a veteran who served in [employer]. He doesn't identify any current stress or sleep concerns. He is eager to start the program so he can start feeling better.    Expected Outcomes Short: Be less SOB with activity. Long: Increase strength.    Continue Psychosocial Services  Follow up required by staff          Psychosocial Re-Evaluation:   Psychosocial Discharge (Final Psychosocial Re-Evaluation):   Vocational Rehabilitation: Provide vocational rehab assistance to  qualifying candidates.   Vocational Rehab Evaluation & Intervention:  Vocational Rehab - 11/01/24 1033       Initial Vocational Rehab Evaluation & Intervention   Assessment shows need for Vocational Rehabilitation No          Education: Education Goals: Education classes will be provided on a variety of topics geared toward better understanding of heart health and risk factor modification. Participant will state understanding/return demonstration of topics presented as noted by education test scores.  Learning Barriers/Preferences:  Learning Barriers/Preferences - 11/01/24 1033       Learning Barriers/Preferences   Learning Barriers None    Learning Preferences None          General Cardiac Education Topics:  AED/CPR: - Group verbal and written instruction with the use of models to  demonstrate the basic use of the AED with the basic ABC's of resuscitation.   Test and Procedures: - Group verbal and visual presentation and models provide information about basic cardiac anatomy and function. Reviews the testing methods done to diagnose heart disease and the outcomes of the test results. Describes the treatment choices: Medical Management, Angioplasty, or Coronary Bypass Surgery for treating various heart conditions including Myocardial Infarction, Angina, Valve Disease, and Cardiac Arrhythmias. Written material provided at class time.   Medication Safety: - Group verbal and visual instruction to review commonly prescribed medications for heart and lung disease. Reviews the medication, class of the drug, and side effects. Includes the steps to properly store meds and maintain the prescription regimen. Written material provided at class time.   Intimacy: - Group verbal instruction through game format to discuss how heart and lung disease can affect sexual intimacy. Written material provided at class time.   Know Your Numbers and Heart Failure: - Group verbal and visual instruction to discuss disease risk factors for cardiac and pulmonary disease and treatment options.  Reviews associated critical values for Overweight/Obesity, Hypertension, Cholesterol, and Diabetes.  Discusses basics of heart failure: signs/symptoms and treatments.  Introduces Heart Failure Zone chart for action plan for heart failure. Written material provided at class time.   Infection Prevention: - Provides verbal and written material to individual with discussion of infection control including proper hand washing and proper equipment cleaning during exercise session.   Falls Prevention: - Provides verbal and written material to individual with discussion of falls prevention and safety.   Other: -Provides group and verbal instruction on various topics (see comments)   Knowledge Questionnaire  Score:   Core Components/Risk Factors/Patient Goals at Admission:  Personal Goals and Risk Factors at Admission - 11/01/24 1028       Core Components/Risk Factors/Patient Goals on Admission   Improve shortness of breath with ADL's Yes    Intervention Provide education, individualized exercise plan and daily activity instruction to help decrease symptoms of SOB with activities of daily living.    Expected Outcomes Short Term: Improve cardiorespiratory fitness to achieve a reduction of symptoms when performing ADLs;Long Term: Be able to perform more ADLs without symptoms or delay the onset of symptoms    Hypertension Yes    Intervention Provide education on lifestyle modifcations including regular physical activity/exercise, weight management, moderate sodium restriction and increased consumption of fresh fruit, vegetables, and low fat dairy, alcohol moderation, and smoking cessation.;Monitor prescription use compliance.    Expected Outcomes Short Term: Continued assessment and intervention until BP is < 140/62mm HG in hypertensive participants. < 130/71mm HG in hypertensive participants with diabetes, heart failure or chronic kidney disease.;Long  Term: Maintenance of blood pressure at goal levels.    Lipids Yes    Intervention Provide education and support for participant on nutrition & aerobic/resistive exercise along with prescribed medications to achieve LDL 70mg , HDL >40mg .    Expected Outcomes Long Term: Cholesterol controlled with medications as prescribed, with individualized exercise RX and with personalized nutrition plan. Value goals: LDL < 70mg , HDL > 40 mg.;Short Term: Participant states understanding of desired cholesterol values and is compliant with medications prescribed. Participant is following exercise prescription and nutrition guidelines.          Education:Diabetes - Individual verbal and written instruction to review signs/symptoms of diabetes, desired ranges of glucose  level fasting, after meals and with exercise. Acknowledge that pre and post exercise glucose checks will be done for 3 sessions at entry of program.   Core Components/Risk Factors/Patient Goals Review:    Core Components/Risk Factors/Patient Goals at Discharge (Final Review):    ITP Comments:  ITP Comments     Row Name 11/01/24 1037 11/05/24 1430         ITP Comments Completed virtual orientation today.  EP evaluation is scheduled for 11/05/24 at 1:00 .  Documentation for diagnosis can be found in [facility] encounter 08/17/24. Patient attend orientation today.  Patient is attending Cardiac Rehabilitation Program.  Documentation for diagnosis can be found in [facility] 08/17/24.  Reviewed medical chart, RPE/RPD, gym safety, and program guidelines.  Patient was fitted to equipment they will be using during rehab.  Patient is scheduled to start exercise on Thursday 11/07/24 at 1030.   Initial ITP created and sent for review and signature by Dr. Dorn Ross, Medical Director for Cardiac Rehabilitation Program.         Comments: Initial ITP    [1]  Current Outpatient Medications:    ascorbic acid (VITAMIN C) 500 MG tablet, Take 500 mg by mouth daily. (Patient not taking: Reported on 11/01/2024), Disp: , Rfl:    aspirin buffered (BUFFERIN) 325 MG TABS tablet, Take 81 mg by mouth., Disp: , Rfl:    atorvastatin (LIPITOR) 80 MG tablet, Take 80 mg by mouth at bedtime., Disp: , Rfl:    buPROPion (WELLBUTRIN XL) 300 MG 24 hr tablet, Take 300 mg by mouth daily., Disp: , Rfl:    clopidogrel (PLAVIX) 75 MG tablet, Take 75 mg by mouth daily., Disp: , Rfl:    dicyclomine  (BENTYL ) 10 MG capsule, Take 1 capsule (10 mg total) by mouth 4 (four) times daily -  before meals and at bedtime. For loose stools (Patient not taking: Reported on 11/01/2024), Disp: 120 capsule, Rfl: 0   lisinopril (ZESTRIL) 10 MG tablet, Take 5 mg by mouth daily. (Patient not taking: Reported on 11/01/2024), Disp: , Rfl:    pantoprazole  (PROTONIX ) 40  MG tablet, Take 1 tablet (40 mg total) by mouth 2 (two) times daily. (Patient not taking: Reported on 11/01/2024), Disp: 60 tablet, Rfl: 11   traZODone  (DESYREL ) 100 MG tablet, Take 100 mg by mouth at bedtime., Disp: , Rfl:  [2]  Social History Tobacco Use  Smoking Status Never  Smokeless Tobacco Never

## 2024-11-05 NOTE — Patient Instructions (Signed)
 Patient Instructions  Patient Details  Name: Brett Murray MRN: [id number] Date of Birth: 05-Dec-1985 Referring Provider:  Melba Burnard SAUNDERS, FNP  Below are your personal goals for exercise, nutrition, and risk factors. Our goal is to help you stay on track towards obtaining and maintaining these goals. We will be discussing your progress on these goals with you throughout the program.  Initial Exercise Prescription:  Initial Exercise Prescription - 11/05/24 1400       Date of Initial Exercise RX and Referring Provider   Date 11/05/24    Referring Provider Melba Burnard NP ([place])   Primary Cardiologist Dr. Marcello Lennox [facility])     Oxygen   Maintain Oxygen Saturation 88% or higher      Treadmill   MPH 3.2    Grade 2    Minutes 15    METs 4.33      Elliptical   Level 4    Speed 4    Minutes 15    METs 4.5      Prescription Details   Frequency (times per week) 3    Duration Progress to 30 minutes of continuous aerobic without signs/symptoms of physical distress      Intensity   THRR 40-80% of Max Heartrate 113-158    Ratings of Perceived Exertion 11-13    Perceived Dyspnea 0-4      Progression   Progression Continue to progress workloads to maintain intensity without signs/symptoms of physical distress.      Resistance Training   Training Prescription Yes    Weight 8 lb    Reps 10-15          Exercise Goals: Frequency: Be able to perform aerobic exercise two to three times per week in program working toward 2-5 days per week of home exercise.  Intensity: Work with a perceived exertion of 11 (fairly light) - 15 (hard) while following your exercise prescription.  We will make changes to your prescription with you as you progress through the program.   Duration: Be able to do 30 to 45 minutes of continuous aerobic exercise in addition to a 5 minute warm-up and a 5 minute cool-down routine.   Nutrition Goals: Your personal nutrition goals will be established when  you do your nutrition analysis with the dietician.  The following are general nutrition guidelines to follow: Cholesterol < 200mg /day Sodium < 1500mg /day Fiber: Men under 50 yrs - 38 grams per day  Personal Goals:  Personal Goals and Risk Factors at Admission - 11/01/24 1028       Core Components/Risk Factors/Patient Goals on Admission   Improve shortness of breath with ADL's Yes    Intervention Provide education, individualized exercise plan and daily activity instruction to help decrease symptoms of SOB with activities of daily living.    Expected Outcomes Short Term: Improve cardiorespiratory fitness to achieve a reduction of symptoms when performing ADLs;Long Term: Be able to perform more ADLs without symptoms or delay the onset of symptoms    Hypertension Yes    Intervention Provide education on lifestyle modifcations including regular physical activity/exercise, weight management, moderate sodium restriction and increased consumption of fresh fruit, vegetables, and low fat dairy, alcohol moderation, and smoking cessation.;Monitor prescription use compliance.    Expected Outcomes Short Term: Continued assessment and intervention until BP is < 140/82mm HG in hypertensive participants. < 130/6mm HG in hypertensive participants with diabetes, heart failure or chronic kidney disease.;Long Term: Maintenance of blood pressure at goal levels.    Lipids  Yes    Intervention Provide education and support for participant on nutrition & aerobic/resistive exercise along with prescribed medications to achieve LDL 70mg , HDL >40mg .    Expected Outcomes Long Term: Cholesterol controlled with medications as prescribed, with individualized exercise RX and with personalized nutrition plan. Value goals: LDL < 70mg , HDL > 40 mg.;Short Term: Participant states understanding of desired cholesterol values and is compliant with medications prescribed. Participant is following exercise prescription and nutrition  guidelines.          Tobacco Use Initial Evaluation: Social History   Tobacco Use  Smoking Status Never  Smokeless Tobacco Never    Exercise Goals and Review:  Exercise Goals     Row Name 11/01/24 1032             Exercise Goals   Increase Physical Activity Yes       Intervention Provide advice, education, support and counseling about physical activity/exercise needs.;Develop an individualized exercise prescription for aerobic and resistive training based on initial evaluation findings, risk stratification, comorbidities and participant's personal goals.       Expected Outcomes Short Term: Attend rehab on a regular basis to increase amount of physical activity.;Long Term: Add in home exercise to make exercise part of routine and to increase amount of physical activity.;Long Term: Exercising regularly at least 3-5 days a week.       Increase Strength and Stamina Yes       Intervention Develop an individualized exercise prescription for aerobic and resistive training based on initial evaluation findings, risk stratification, comorbidities and participant's personal goals.;Provide advice, education, support and counseling about physical activity/exercise needs.       Expected Outcomes Short Term: Increase workloads from initial exercise prescription for resistance, speed, and METs.;Short Term: Perform resistance training exercises routinely during rehab and add in resistance training at home;Long Term: Improve cardiorespiratory fitness, muscular endurance and strength as measured by increased METs and functional capacity ( )       Able to understand and use rate of perceived exertion (RPE) scale Yes       Intervention Provide education and explanation on how to use RPE scale       Expected Outcomes Short Term: Able to use RPE daily in rehab to express subjective intensity level;Long Term:  Able to use RPE to guide intensity level when exercising independently       Able to understand  and use Dyspnea scale Yes       Intervention Provide education and explanation on how to use Dyspnea scale       Expected Outcomes Long Term: Able to use Dyspnea scale to guide intensity level when exercising independently;Short Term: Able to use Dyspnea scale daily in rehab to express subjective sense of shortness of breath during exertion       Knowledge and understanding of Target Heart Rate Range (THRR) Yes       Intervention Provide education and explanation of THRR including how the numbers were predicted and where they are located for reference       Expected Outcomes Short Term: Able to state/look up THRR;Short Term: Able to use daily as guideline for intensity in rehab;Long Term: Able to use THRR to govern intensity when exercising independently       Able to check pulse independently Yes       Intervention Provide education and demonstration on how to check pulse in carotid and radial arteries.;Review the importance of being able to check your own pulse for  safety during independent exercise       Expected Outcomes Short Term: Able to explain why pulse checking is important during independent exercise;Long Term: Able to check pulse independently and accurately       Understanding of Exercise Prescription Yes       Intervention Provide education, explanation, and written materials on patient's individual exercise prescription       Expected Outcomes Short Term: Able to explain program exercise prescription;Long Term: Able to explain home exercise prescription to exercise independently        Copy of goals given to participant.

## 2024-11-07 ENCOUNTER — Encounter (HOSPITAL_COMMUNITY)

## 2024-11-08 ENCOUNTER — Encounter (HOSPITAL_COMMUNITY)
Admission: RE | Admit: 2024-11-08 | Discharge: 2024-11-08 | Disposition: A | Source: Ambulatory Visit | Attending: Nurse Practitioner | Admitting: Nurse Practitioner

## 2024-11-08 DIAGNOSIS — I2510 Atherosclerotic heart disease of native coronary artery without angina pectoris: Secondary | ICD-10-CM

## 2024-11-08 DIAGNOSIS — I214 Non-ST elevation (NSTEMI) myocardial infarction: Secondary | ICD-10-CM

## 2024-11-08 NOTE — Progress Notes (Signed)
 Daily Session Note  Patient Details  Name: Brett Murray MRN: [id number] Date of Birth: [birth year] Referring Provider:   Flowsheet Row CARDIAC REHAB PHASE II ORIENTATION from 11/05/2024 in [facility] CARDIAC REHABILITATION  Referring Provider Melba Sor NP [facility])  [facility] Cardiologist Dr. Marcello Lennox ([facility])]    Encounter Date: 11/08/2024  Check In:  Session Check In - 11/08/24 0759       Check-In   Supervising physician immediately available to respond to emergencies See telemetry face sheet for immediately available MD    Location [facility]    Staff Present Adrien Louder, RN, BSN;Heather Con, BS, Exercise Physiologist;Ivon Oelkers Vonzell, MA, RCEP, CCRP, CCET    Virtual Visit No    Medication changes reported     No    Fall or balance concerns reported    No    Warm-up and Cool-down Performed on first and last piece of equipment    Resistance Training Performed Yes    VAD Patient? No    PAD/SET Patient? No      Pain Assessment   Currently in Pain? No/denies          Capillary Blood Glucose: No results found for this or any previous visit (from the past 24 hours).    Tobacco Use History[1]  Goals Met:  Exercise tolerated well Personal goals reviewed No report of concerns or symptoms today Strength training completed today  Goals Unmet:  Not Applicable  Comments: First full day of exercise!  Patient was oriented to gym and equipment including functions, settings, policies, and procedures.  Patient's individual exercise prescription and treatment plan were reviewed.  All starting workloads were established based on the results of the 6 minute walk test done at initial orientation visit.  The plan for exercise progression was also introduced and progression will be customized based on patient's performance and goals.        [1]  Social History Tobacco Use  Smoking Status Never  Smokeless Tobacco Never

## 2024-11-12 ENCOUNTER — Encounter (HOSPITAL_COMMUNITY)
Admission: RE | Admit: 2024-11-12 | Discharge: 2024-11-12 | Disposition: A | Source: Ambulatory Visit | Attending: Nurse Practitioner | Admitting: Nurse Practitioner

## 2024-11-12 DIAGNOSIS — I2510 Atherosclerotic heart disease of native coronary artery without angina pectoris: Secondary | ICD-10-CM

## 2024-11-12 DIAGNOSIS — I214 Non-ST elevation (NSTEMI) myocardial infarction: Secondary | ICD-10-CM

## 2024-11-12 NOTE — Progress Notes (Signed)
 Daily Session Note  Patient Details  Name: Brett Murray MRN: [id number] Date of Birth: 1986/10/10 Referring Provider:   Flowsheet Row CARDIAC REHAB PHASE II ORIENTATION from 11/05/2024 in [facility] CARDIAC REHABILITATION  Referring Provider Melba Sor NP [facility])  [facility] Cardiologist Dr. Marcello Lennox ([facility])]    Encounter Date: 11/12/2024  Check In:  Session Check In - 11/12/24 1335       Check-In   Supervising physician immediately available to respond to emergencies See telemetry face sheet for immediately available MD    Location [facility]    Staff Present Laymon Rattler, BSN, RN, WTA-C;Heather Con, BS, Exercise Physiologist;Victoria Zina, RN    Virtual Visit No    Medication changes reported     No    Fall or balance concerns reported    No    Tobacco Cessation No Change    Warm-up and Cool-down Performed on first and last piece of equipment    Resistance Training Performed Yes    VAD Patient? No    PAD/SET Patient? No      Pain Assessment   Currently in Pain? No/denies          Capillary Blood Glucose: No results found for this or any previous visit (from the past 24 hours).    Tobacco Use History[1]  Goals Met:  Independence with exercise equipment Exercise tolerated well No report of concerns or symptoms today Strength training completed today  Goals Unmet:  Not Applicable  Comments: Pt able to follow exercise prescription today without complaint.  Will continue to monitor for progression.        [1]  Social History Tobacco Use  Smoking Status Never  Smokeless Tobacco Never

## 2024-11-14 ENCOUNTER — Encounter (HOSPITAL_COMMUNITY)
Admission: RE | Admit: 2024-11-14 | Discharge: 2024-11-14 | Disposition: A | Source: Ambulatory Visit | Attending: Nurse Practitioner | Admitting: Nurse Practitioner

## 2024-11-14 DIAGNOSIS — I214 Non-ST elevation (NSTEMI) myocardial infarction: Secondary | ICD-10-CM

## 2024-11-14 DIAGNOSIS — I2510 Atherosclerotic heart disease of native coronary artery without angina pectoris: Secondary | ICD-10-CM

## 2024-11-14 NOTE — Progress Notes (Signed)
 Daily Session Note  Patient Details  Name: Brett Murray MRN: [id number] Date of Birth: [birth year] Referring Provider:   Flowsheet Row CARDIAC REHAB PHASE II ORIENTATION from 11/05/2024 in [facility] CARDIAC REHABILITATION  Referring Provider Melba Sor NP [facility])  [facility] Cardiologist Dr. Marcello Lennox ([facility])]    Encounter Date: 11/14/2024  Check In:  Session Check In - 11/14/24 1315       Check-In   Supervising physician immediately available to respond to emergencies See telemetry face sheet for immediately available MD    Location [facility]    Staff Present Adrien Louder, RN, BSN;Jessica Vonzell, MA, RCEP, CCRP, CCET;[employer], RN    Virtual Visit No    Medication changes reported     No    Fall or balance concerns reported    No    Warm-up and Cool-down Performed on first and last piece of equipment    Resistance Training Performed Yes    VAD Patient? No    PAD/SET Patient? No      Pain Assessment   Currently in Pain? No/denies    Multiple Pain Sites No          Capillary Blood Glucose: No results found for this or any previous visit (from the past 24 hours).    Tobacco Use History[1]  Goals Met:  Independence with exercise equipment Exercise tolerated well No report of concerns or symptoms today Strength training completed today  Goals Unmet:  Not Applicable  Comments: Pt able to follow exercise prescription today without complaint.  Will continue to monitor for progression.        [1]  Social History Tobacco Use  Smoking Status Never  Smokeless Tobacco Never

## 2024-11-19 ENCOUNTER — Encounter (HOSPITAL_COMMUNITY)

## 2024-11-22 ENCOUNTER — Encounter (HOSPITAL_COMMUNITY)
Admission: RE | Admit: 2024-11-22 | Discharge: 2024-11-22 | Disposition: A | Source: Ambulatory Visit | Attending: Nurse Practitioner | Admitting: Nurse Practitioner

## 2024-11-22 DIAGNOSIS — I214 Non-ST elevation (NSTEMI) myocardial infarction: Secondary | ICD-10-CM

## 2024-11-22 DIAGNOSIS — I2510 Atherosclerotic heart disease of native coronary artery without angina pectoris: Secondary | ICD-10-CM

## 2024-11-22 NOTE — Progress Notes (Signed)
 Daily Session Note  Patient Details  Name: Brett Murray MRN: [id number] Date of Birth: 1985/11/25 Referring Provider:   Flowsheet Row CARDIAC REHAB PHASE II ORIENTATION from 11/05/2024 in [facility] CARDIAC REHABILITATION  Referring Provider Melba Sor NP [facility])  [facility] Cardiologist Dr. Marcello Lennox ([facility])]    Encounter Date: 11/22/2024  Check In:  Session Check In - 11/22/24 0753       Check-In   Supervising physician immediately available to respond to emergencies See telemetry face sheet for immediately available MD    Location [facility]    Staff Present Laymon Rattler, BSN, RN, WTA-C;Heather Con, BS, Exercise Physiologist    Virtual Visit No    Medication changes reported     No    Fall or balance concerns reported    No    Tobacco Cessation No Change    Warm-up and Cool-down Performed on first and last piece of equipment    Resistance Training Performed Yes    VAD Patient? No    PAD/SET Patient? No      Pain Assessment   Currently in Pain? No/denies          Capillary Blood Glucose: No results found for this or any previous visit (from the past 24 hours).    Tobacco Use History[1]  Goals Met:  Independence with exercise equipment Exercise tolerated well No report of concerns or symptoms today Strength training completed today  Goals Unmet:  Not Applicable  Comments: Pt able to follow exercise prescription today without complaint.  Will continue to monitor for progression.        [1]  Social History Tobacco Use  Smoking Status Never  Smokeless Tobacco Never

## 2024-11-25 ENCOUNTER — Encounter (HOSPITAL_COMMUNITY)

## 2024-11-26 ENCOUNTER — Encounter (HOSPITAL_COMMUNITY): Payer: Self-pay

## 2024-11-26 ENCOUNTER — Encounter (HOSPITAL_COMMUNITY): Payer: Self-pay | Admitting: *Deleted

## 2024-11-26 DIAGNOSIS — I2510 Atherosclerotic heart disease of native coronary artery without angina pectoris: Secondary | ICD-10-CM

## 2024-11-26 DIAGNOSIS — I214 Non-ST elevation (NSTEMI) myocardial infarction: Secondary | ICD-10-CM

## 2024-11-26 NOTE — Progress Notes (Signed)
 Cardiac Individual Treatment Plan  Patient Details  Name: Brett Murray MRN: [id number] Date of Birth: Nov 22, 1985 Referring Provider:   Flowsheet Row CARDIAC REHAB PHASE II ORIENTATION from 11/05/2024 in [facility] CARDIAC REHABILITATION  Referring Provider Melba Sor NP ([place])  [facility] Cardiologist Dr. Marcello Lennox ([facility])]    Initial Encounter Date:  Flowsheet Row CARDIAC REHAB PHASE II ORIENTATION from 11/05/2024 in [facility] [place] CARDIAC REHABILITATION  Date 11/05/24    Visit Diagnosis: NSTEMI (non-ST elevated myocardial infarction) [facility])  Atherosclerosis of native coronary artery of native heart without angina pectoris  Patient's Home Medications on Admission: Current Medications[1]  Past Medical History: Past Medical History:  Diagnosis Date   Anxiety    Depression    GERD (gastroesophageal reflux disease)    Hypertension    PTSD (post-traumatic stress disorder)     Tobacco Use: Tobacco Use History[2]  Labs: Review Flowsheet        No data to display          Capillary Blood Glucose: No results found for: GLUCAP   Exercise Target Goals: Exercise Program Goal: Individual exercise prescription set using results from initial 6 min walk test and THRR while considering  patients activity barriers and safety.   Exercise Prescription Goal: Starting with aerobic activity 30 plus minutes a day, 3 days per week for initial exercise prescription. Provide home exercise prescription and guidelines that participant acknowledges understanding prior to discharge.  Activity Barriers & Risk Stratification:  Activity Barriers & Cardiac Risk Stratification - 11/01/24 1027       Activity Barriers & Cardiac Risk Stratification   Activity Barriers Chest Pain/Angina;Deconditioning;Muscular Weakness;Shortness of Breath    Cardiac Risk Stratification Moderate          6 Minute Walk:  6 Minute Walk     Row Name 11/05/24 1431         6 Minute Walk   Phase Initial      Distance 1705 feet     Walk Time 6 minutes     # of Rest Breaks 0     MPH 3.23     METS 5.15     RPE 9     Perceived Dyspnea  2     VO2 Peak 18.03     Symptoms Yes (comment)     Comments SOB     Resting HR 69 bpm     Resting BP 118/66     Resting Oxygen Saturation  95 %     Exercise Oxygen Saturation  during 6 min walk 97 %     Max Ex. HR 101 bpm     Max Ex. BP 126/72     2 Minute Post BP 122/66        Oxygen Initial Assessment:   Oxygen Re-Evaluation:   Oxygen Discharge (Final Oxygen Re-Evaluation):   Initial Exercise Prescription:  Initial Exercise Prescription - 11/05/24 1400       Date of Initial Exercise RX and Referring Provider   Date 11/05/24    Referring Provider Melba Sor NP ([place])   Primary Cardiologist Dr. Marcello Lennox [facility])     Oxygen   Maintain Oxygen Saturation 88% or higher      Treadmill   MPH 3.2    Grade 2    Minutes 15    METs 4.33      Elliptical   Level 4    Speed 4    Minutes 15    METs 4.5  Prescription Details   Frequency (times per week) 3    Duration Progress to 30 minutes of continuous aerobic without signs/symptoms of physical distress      Intensity   THRR 40-80% of Max Heartrate 113-158    Ratings of Perceived Exertion 11-13    Perceived Dyspnea 0-4      Progression   Progression Continue to progress workloads to maintain intensity without signs/symptoms of physical distress.      Resistance Training   Training Prescription Yes    Weight 8 lb    Reps 10-15          Perform Capillary Blood Glucose checks as needed.  Exercise Prescription Changes:   Exercise Prescription Changes     Row Name 11/05/24 1400 11/12/24 1500           Response to Exercise   Blood Pressure (Admit) 118/66 118/70      Blood Pressure (Exercise) 126/72 130/62      Blood Pressure (Exit) 122/66 110/80      Heart Rate (Admit) 69 bpm 83 bpm      Heart Rate (Exercise) 101 bpm 150 bpm      Heart Rate (Exit) 63 bpm 83  bpm      Oxygen Saturation (Admit) 95 % --      Oxygen Saturation (Exercise) 97 % --      Rating of Perceived Exertion (Exercise) 9 10      Perceived Dyspnea (Exercise) 2 --      Symptoms SOB --      Comments walk test results --      Duration -- Continue with 30 min of aerobic exercise without signs/symptoms of physical distress.      Intensity -- THRR unchanged        Progression   Progression -- Continue to progress workloads to maintain intensity without signs/symptoms of physical distress.        Resistance Training   Weight -- 8      Reps -- 10-15        Treadmill   MPH -- 3.5      Grade -- 2      Minutes -- 15      METs -- 4.65        Elliptical   Level -- 10      Speed -- 70      Minutes -- 15      METs -- 8.6         Exercise Comments:   Exercise Comments     Row Name 11/01/24 1032 11/08/24 0759         Exercise Comments States he doesn't do any exercise right now, but he has an active job. First full day of exercise!  Patient was oriented to gym and equipment including functions, settings, policies, and procedures.  Patient's individual exercise prescription and treatment plan were reviewed.  All starting workloads were established based on the results of the 6 minute walk test done at initial orientation visit.  The plan for exercise progression was also introduced and progression will be customized based on patient's performance and goals.         Exercise Goals and Review:   Exercise Goals     Row Name 11/01/24 1032             Exercise Goals   Increase Physical Activity Yes       Intervention Provide advice, education, support and counseling about physical activity/exercise needs.;Develop an individualized  exercise prescription for aerobic and resistive training based on initial evaluation findings, risk stratification, comorbidities and participant's personal goals.       Expected Outcomes Short Term: Attend rehab on a regular basis to increase  amount of physical activity.;Long Term: Add in home exercise to make exercise part of routine and to increase amount of physical activity.;Long Term: Exercising regularly at least 3-5 days a week.       Increase Strength and Stamina Yes       Intervention Develop an individualized exercise prescription for aerobic and resistive training based on initial evaluation findings, risk stratification, comorbidities and participant's personal goals.;Provide advice, education, support and counseling about physical activity/exercise needs.       Expected Outcomes Short Term: Increase workloads from initial exercise prescription for resistance, speed, and METs.;Short Term: Perform resistance training exercises routinely during rehab and add in resistance training at home;Long Term: Improve cardiorespiratory fitness, muscular endurance and strength as measured by increased METs and functional capacity ( )       Able to understand and use rate of perceived exertion (RPE) scale Yes       Intervention Provide education and explanation on how to use RPE scale       Expected Outcomes Short Term: Able to use RPE daily in rehab to express subjective intensity level;Long Term:  Able to use RPE to guide intensity level when exercising independently       Able to understand and use Dyspnea scale Yes       Intervention Provide education and explanation on how to use Dyspnea scale       Expected Outcomes Long Term: Able to use Dyspnea scale to guide intensity level when exercising independently;Short Term: Able to use Dyspnea scale daily in rehab to express subjective sense of shortness of breath during exertion       Knowledge and understanding of Target Heart Rate Range (THRR) Yes       Intervention Provide education and explanation of THRR including how the numbers were predicted and where they are located for reference       Expected Outcomes Short Term: Able to state/look up THRR;Short Term: Able to use daily as  guideline for intensity in rehab;Long Term: Able to use THRR to govern intensity when exercising independently       Able to check pulse independently Yes       Intervention Provide education and demonstration on how to check pulse in carotid and radial arteries.;Review the importance of being able to check your own pulse for safety during independent exercise       Expected Outcomes Short Term: Able to explain why pulse checking is important during independent exercise;Long Term: Able to check pulse independently and accurately       Understanding of Exercise Prescription Yes       Intervention Provide education, explanation, and written materials on patient's individual exercise prescription       Expected Outcomes Short Term: Able to explain program exercise prescription;Long Term: Able to explain home exercise prescription to exercise independently          Exercise Goals Re-Evaluation :  Exercise Goals Re-Evaluation     Row Name 11/08/24 0801             Exercise Goal Re-Evaluation   Exercise Goals Review Knowledge and understanding of Target Heart Rate Range (THRR);Able to understand and use rate of perceived exertion (RPE) scale;Able to understand and use Dyspnea scale;Understanding of Exercise Prescription  Comments Reviewed RPE and dyspnea scale, THR and program prescription with pt today.  Pt voiced understanding and was given a copy of goals to take home.       Expected Outcomes Short: Use RPE daily to regulate intensity.  Long: Follow program prescription in THR.           Discharge Exercise Prescription (Final Exercise Prescription Changes):  Exercise Prescription Changes - 11/12/24 1500       Response to Exercise   Blood Pressure (Admit) 118/70    Blood Pressure (Exercise) 130/62    Blood Pressure (Exit) 110/80    Heart Rate (Admit) 83 bpm    Heart Rate (Exercise) 150 bpm    Heart Rate (Exit) 83 bpm    Rating of Perceived Exertion (Exercise) 10    Duration  Continue with 30 min of aerobic exercise without signs/symptoms of physical distress.    Intensity THRR unchanged      Progression   Progression Continue to progress workloads to maintain intensity without signs/symptoms of physical distress.      Resistance Training   Weight 8    Reps 10-15      Treadmill   MPH 3.5    Grade 2    Minutes 15    METs 4.65      Elliptical   Level 10    Speed 70    Minutes 15    METs 8.6          Nutrition:  Target Goals: Understanding of nutrition guidelines, daily intake of sodium 1500mg , cholesterol 200mg , calories 30% from fat and 7% or less from saturated fats, daily to have 5 or more servings of fruits and vegetables.  Biometrics:  Pre Biometrics - 11/05/24 1437       Pre Biometrics   Height 5' 8.5 (1.74 m)    Weight 193 lb 6.4 oz (87.7 kg)    Waist Circumference 36 inches    Hip Circumference 38 inches    Waist to Hip Ratio 0.95 %    BMI (Calculated) 28.98    Grip Strength 23.1 kg    Single Leg Stand 30 seconds           Nutrition Therapy Plan and Nutrition Goals:  Nutrition Therapy & Goals - 11/01/24 1033       Intervention Plan   Intervention Nutrition handout(s) given to patient.;Prescribe, educate and counsel regarding individualized specific dietary modifications aiming towards targeted core components such as weight, hypertension, lipid management, diabetes, heart failure and other comorbidities.    Expected Outcomes Long Term Goal: Adherence to prescribed nutrition plan.;Short Term Goal: A plan has been developed with personal nutrition goals set during dietitian appointment.;Short Term Goal: Understand basic principles of dietary content, such as calories, fat, sodium, cholesterol and nutrients.          Nutrition Assessments:  MEDIFICTS Score Key: >=70 Need to make dietary changes  40-70 Heart Healthy Diet <= 40 Therapeutic Level Cholesterol Diet   Picture Your Plate Scores: <59 Unhealthy dietary  pattern with much room for improvement. 41-50 Dietary pattern unlikely to meet recommendations for good health and room for improvement. 51-60 More healthful dietary pattern, with some room for improvement.  >60 Healthy dietary pattern, although there may be some specific behaviors that could be improved.    Nutrition Goals Re-Evaluation:   Nutrition Goals Discharge (Final Nutrition Goals Re-Evaluation):   Psychosocial: Target Goals: Acknowledge presence or absence of significant depression and/or stress, maximize coping skills, provide positive support  system. Participant is able to verbalize types and ability to use techniques and skills needed for reducing stress and depression.  Initial Review & Psychosocial Screening:  Initial Psych Review & Screening - 11/01/24 1033       Initial Review   Current issues with Current Psychotropic Meds;History of Depression;None Identified      Family Dynamics   Good Support System? Yes    Comments Has his aunt and uncle and girlfriend as a support system.      Barriers   Psychosocial barriers to participate in program There are no identifiable barriers or psychosocial needs.      Screening Interventions   Interventions Encouraged to exercise    Expected Outcomes Long Term goal: The participant improves quality of Life and PHQ9 Scores as seen by post scores and/or verbalization of changes;Short Term goal: Identification and review with participant of any Quality of Life or Depression concerns found by scoring the questionnaire.;Long Term Goal: Stressors or current issues are controlled or eliminated.;Short Term goal: Utilizing psychosocial counselor, staff and physician to assist with identification of specific Stressors or current issues interfering with healing process. Setting desired goal for each stressor or current issue identified.          Quality of Life Scores:  Scores of 19 and below usually indicate a poorer quality of life in  these areas.  A difference of  2-3 points is a clinically meaningful difference.  A difference of 2-3 points in the total score of the Quality of Life Index has been associated with significant improvement in overall quality of life, self-image, physical symptoms, and general health in studies assessing change in quality of life.  PHQ-9: Review Flowsheet       11/05/2024  Depression screen PHQ 2/9  Decreased Interest 0  Down, Depressed, Hopeless 0  PHQ - 2 Score 0  Altered sleeping 0  Tired, decreased energy 1  Change in appetite 0  Feeling bad or failure about yourself  0  Trouble concentrating 0  Moving slowly or fidgety/restless 0  Suicidal thoughts 0  PHQ-9 Score 1  Difficult doing work/chores Not difficult at all   Interpretation of Total Score  Total Score Depression Severity:  1-4 = Minimal depression, 5-9 = Mild depression, 10-14 = Moderate depression, 15-19 = Moderately severe depression, 20-27 = Severe depression   Psychosocial Evaluation and Intervention:  Psychosocial Evaluation - 11/01/24 1034       Psychosocial Evaluation & Interventions   Interventions Encouraged to exercise with the program and follow exercise prescription    Comments Sampson is a pleasant 39 year old coming into cardiac rehab for a NSTEMI back in October. He is employed at [employer] in [place]. He currently doesn't exercise but he states his job is an active job. His mobility is well and he does state that he gets a little SOB and chest pain with exertional activites. He has his aunt, uncle and girlfriend for support. He is a veteran who served in [employer]. He doesn't identify any current stress or sleep concerns. He is eager to start the program so he can start feeling better.    Expected Outcomes Short: Be less SOB with activity. Long: Increase strength.    Continue Psychosocial Services  Follow up required by staff          Psychosocial Re-Evaluation:   Psychosocial Discharge (Final  Psychosocial Re-Evaluation):   Vocational Rehabilitation: Provide vocational rehab assistance to qualifying candidates.   Vocational Rehab Evaluation &  Intervention:  Vocational Rehab - 11/01/24 1033       Initial Vocational Rehab Evaluation & Intervention   Assessment shows need for Vocational Rehabilitation No          Education: Education Goals: Education classes will be provided on a weekly basis, covering required topics. Participant will state understanding/return demonstration of topics presented.  Learning Barriers/Preferences:  Learning Barriers/Preferences - 11/01/24 1033       Learning Barriers/Preferences   Learning Barriers None    Learning Preferences None          Education Topics: Hypertension, Hypertension Reduction -Define heart disease and high blood pressure. Discus how high blood pressure affects the body and ways to reduce high blood pressure.   Exercise and Your Heart -Discuss why it is important to exercise, the FITT principles of exercise, normal and abnormal responses to exercise, and how to exercise safely.   Angina -Discuss definition of angina, causes of angina, treatment of angina, and how to decrease risk of having angina.   Cardiac Medications -Review what the following cardiac medications are used for, how they affect the body, and side effects that may occur when taking the medications.  Medications include Aspirin, Beta blockers, calcium channel blockers, ACE Inhibitors, angiotensin receptor blockers, diuretics, digoxin, and antihyperlipidemics.   Congestive Heart Failure -Discuss the definition of CHF, how to live with CHF, the signs and symptoms of CHF, and how keep track of weight and sodium intake.   Heart Disease and Intimacy -Discus the effect sexual activity has on the heart, how changes occur during intimacy as we age, and safety during sexual activity.   Smoking Cessation / COPD -Discuss different methods to quit  smoking, the health benefits of quitting smoking, and the definition of COPD.   Nutrition I: Fats -Discuss the types of cholesterol, what cholesterol does to the heart, and how cholesterol levels can be controlled.   Nutrition II: Labels -Discuss the different components of food labels and how to read food label   Heart Parts/Heart Disease and PAD -Discuss the anatomy of the heart, the pathway of blood circulation through the heart, and these are affected by heart disease.   Stress I: Signs and Symptoms -Discuss the causes of stress, how stress may lead to anxiety and depression, and ways to limit stress.   Stress II: Relaxation -Discuss different types of relaxation techniques to limit stress.   Warning Signs of Stroke / TIA -Discuss definition of a stroke, what the signs and symptoms are of a stroke, and how to identify when someone is having stroke.   Knowledge Questionnaire Score:   Core Components/Risk Factors/Patient Goals at Admission:  Personal Goals and Risk Factors at Admission - 11/01/24 1028       Core Components/Risk Factors/Patient Goals on Admission   Improve shortness of breath with ADL's Yes    Intervention Provide education, individualized exercise plan and daily activity instruction to help decrease symptoms of SOB with activities of daily living.    Expected Outcomes Short Term: Improve cardiorespiratory fitness to achieve a reduction of symptoms when performing ADLs;Long Term: Be able to perform more ADLs without symptoms or delay the onset of symptoms    Hypertension Yes    Intervention Provide education on lifestyle modifcations including regular physical activity/exercise, weight management, moderate sodium restriction and increased consumption of fresh fruit, vegetables, and low fat dairy, alcohol moderation, and smoking cessation.;Monitor prescription use compliance.    Expected Outcomes Short Term: Continued assessment and intervention until BP is <  140/53mm HG in hypertensive participants. < 130/59mm HG in hypertensive participants with diabetes, heart failure or chronic kidney disease.;Long Term: Maintenance of blood pressure at goal levels.    Lipids Yes    Intervention Provide education and support for participant on nutrition & aerobic/resistive exercise along with prescribed medications to achieve LDL 70mg , HDL >40mg .    Expected Outcomes Long Term: Cholesterol controlled with medications as prescribed, with individualized exercise RX and with personalized nutrition plan. Value goals: LDL < 70mg , HDL > 40 mg.;Short Term: Participant states understanding of desired cholesterol values and is compliant with medications prescribed. Participant is following exercise prescription and nutrition guidelines.          Core Components/Risk Factors/Patient Goals Review:    Core Components/Risk Factors/Patient Goals at Discharge (Final Review):    ITP Comments:  ITP Comments     Row Name 11/01/24 1037 11/05/24 1430 11/08/24 0759 11/26/24 0857     ITP Comments Completed virtual orientation today.  EP evaluation is scheduled for 11/05/24 at 1:00 .  Documentation for diagnosis can be found in [facility] encounter 08/17/24. Patient attend orientation today.  Patient is attending Cardiac Rehabilitation Program.  Documentation for diagnosis can be found in [facility] 08/17/24.  Reviewed medical chart, RPE/RPD, gym safety, and program guidelines.  Patient was fitted to equipment they will be using during rehab.  Patient is scheduled to start exercise on Thursday 11/07/24 at 1030.   Initial ITP created and sent for review and signature by Dr. Dorn Ross, Medical Director for Cardiac Rehabilitation Program. First full day of exercise!  Patient was oriented to gym and equipment including functions, settings, policies, and procedures.  Patient's individual exercise prescription and treatment plan were reviewed.  All starting workloads were established based on the results  of the 6 minute walk test done at initial orientation visit.  The plan for exercise progression was also introduced and progression will be customized based on patient's performance and goals. 30 day review completed. ITP sent to Dr. Dorn Ross, Medical Director of Cardiac Rehab. Continue with ITP unless changes are made by physician.       Comments: 30 day review     [1]  Current Outpatient Medications:    ascorbic acid (VITAMIN C) 500 MG tablet, Take 500 mg by mouth daily. (Patient not taking: Reported on 11/01/2024), Disp: , Rfl:    aspirin buffered (BUFFERIN) 325 MG TABS tablet, Take 81 mg by mouth., Disp: , Rfl:    atorvastatin (LIPITOR) 80 MG tablet, Take 80 mg by mouth at bedtime., Disp: , Rfl:    buPROPion (WELLBUTRIN XL) 300 MG 24 hr tablet, Take 300 mg by mouth daily., Disp: , Rfl:    clopidogrel (PLAVIX) 75 MG tablet, Take 75 mg by mouth daily., Disp: , Rfl:    dicyclomine  (BENTYL ) 10 MG capsule, Take 1 capsule (10 mg total) by mouth 4 (four) times daily -  before meals and at bedtime. For loose stools (Patient not taking: Reported on 11/01/2024), Disp: 120 capsule, Rfl: 0   lisinopril (ZESTRIL) 10 MG tablet, Take 5 mg by mouth daily. (Patient not taking: Reported on 11/01/2024), Disp: , Rfl:    pantoprazole  (PROTONIX ) 40 MG tablet, Take 1 tablet (40 mg total) by mouth 2 (two) times daily. (Patient not taking: Reported on 11/01/2024), Disp: 60 tablet, Rfl: 11   traZODone  (DESYREL ) 100 MG tablet, Take 100 mg by mouth at bedtime., Disp: , Rfl:  [2]  Social History Tobacco Use  Smoking Status Never  Smokeless  Tobacco Never

## 2024-11-27 ENCOUNTER — Encounter (HOSPITAL_COMMUNITY)

## 2024-11-29 ENCOUNTER — Encounter (HOSPITAL_COMMUNITY)

## 2024-12-02 ENCOUNTER — Encounter (HOSPITAL_COMMUNITY)

## 2024-12-04 ENCOUNTER — Encounter (HOSPITAL_COMMUNITY)

## 2024-12-06 ENCOUNTER — Encounter (HOSPITAL_COMMUNITY): Admission: RE | Admit: 2024-12-06 | Source: Ambulatory Visit

## 2024-12-06 DIAGNOSIS — I214 Non-ST elevation (NSTEMI) myocardial infarction: Secondary | ICD-10-CM

## 2024-12-06 DIAGNOSIS — I2510 Atherosclerotic heart disease of native coronary artery without angina pectoris: Secondary | ICD-10-CM

## 2024-12-06 NOTE — Progress Notes (Signed)
 Daily Session Note  Patient Details  Name: TREVONE PRESTWOOD MRN: [id number] Date of Birth: 1986/09/25 Referring Provider:   Flowsheet Row CARDIAC REHAB PHASE II ORIENTATION from 11/05/2024 in [facility] CARDIAC REHABILITATION  Referring Provider Melba Sor NP [facility])  [facility] Cardiologist Dr. Marcello Lennox ([facility])]    Encounter Date: 12/06/2024  Check In:  Session Check In - 12/06/24 0809       Check-In   Supervising physician immediately available to respond to emergencies See telemetry face sheet for immediately available MD    Location [facility]    Staff Present Powell Benders, BS, Exercise Physiologist;Brittany Jackquline, BSN, RN, Rosalba Gelineau, MA, RCEP, CCRP, CCET;Victoria Prado Verde, RN    Virtual Visit No    Medication changes reported     No    Fall or balance concerns reported    No    Tobacco Cessation No Change    Warm-up and Cool-down Performed on first and last piece of equipment    Resistance Training Performed Yes    VAD Patient? No    PAD/SET Patient? No      Pain Assessment   Currently in Pain? No/denies    Multiple Pain Sites No          Capillary Blood Glucose: No results found for this or any previous visit (from the past 24 hours).    Tobacco Use History[1]  Goals Met:  Independence with exercise equipment Exercise tolerated well No report of concerns or symptoms today Strength training completed today  Goals Unmet:  Not Applicable  Comments: Pt able to follow exercise prescription today without complaint.  Will continue to monitor for progression.        [1]  Social History Tobacco Use  Smoking Status Never  Smokeless Tobacco Never

## 2024-12-09 ENCOUNTER — Encounter (HOSPITAL_COMMUNITY)

## 2024-12-11 ENCOUNTER — Encounter (HOSPITAL_COMMUNITY)

## 2024-12-12 ENCOUNTER — Ambulatory Visit (HOSPITAL_COMMUNITY)

## 2024-12-13 ENCOUNTER — Encounter (HOSPITAL_COMMUNITY)

## 2024-12-16 ENCOUNTER — Encounter (HOSPITAL_COMMUNITY)

## 2024-12-18 ENCOUNTER — Encounter (HOSPITAL_COMMUNITY)

## 2024-12-20 ENCOUNTER — Encounter (HOSPITAL_COMMUNITY)

## 2024-12-23 ENCOUNTER — Encounter (HOSPITAL_COMMUNITY)

## 2024-12-25 ENCOUNTER — Encounter (HOSPITAL_COMMUNITY)

## 2024-12-26 ENCOUNTER — Ambulatory Visit (HOSPITAL_COMMUNITY)

## 2024-12-27 ENCOUNTER — Encounter (HOSPITAL_COMMUNITY)

## 2024-12-30 ENCOUNTER — Encounter (HOSPITAL_COMMUNITY)

## 2025-01-01 ENCOUNTER — Encounter (HOSPITAL_COMMUNITY)

## 2025-01-03 ENCOUNTER — Encounter (HOSPITAL_COMMUNITY)

## 2025-01-06 ENCOUNTER — Encounter (HOSPITAL_COMMUNITY)

## 2025-01-08 ENCOUNTER — Encounter (HOSPITAL_COMMUNITY)

## 2025-01-10 ENCOUNTER — Encounter (HOSPITAL_COMMUNITY)

## 2025-01-13 ENCOUNTER — Encounter (HOSPITAL_COMMUNITY)

## 2025-01-15 ENCOUNTER — Encounter (HOSPITAL_COMMUNITY)

## 2025-01-17 ENCOUNTER — Encounter (HOSPITAL_COMMUNITY)

## 2025-01-20 ENCOUNTER — Encounter (HOSPITAL_COMMUNITY)

## 2025-01-22 ENCOUNTER — Encounter (HOSPITAL_COMMUNITY)

## 2025-01-24 ENCOUNTER — Encounter (HOSPITAL_COMMUNITY)

## 2025-01-27 ENCOUNTER — Encounter (HOSPITAL_COMMUNITY)

## 2025-01-29 ENCOUNTER — Encounter (HOSPITAL_COMMUNITY)

## 2025-01-31 ENCOUNTER — Encounter (HOSPITAL_COMMUNITY)

## 2025-02-03 ENCOUNTER — Encounter (HOSPITAL_COMMUNITY)
